# Patient Record
Sex: Male | Born: 1975 | Smoking: Current every day smoker
Health system: Southern US, Community
[De-identification: ages and names within clinical notes are randomized; demographics above are authoritative.]

## PROBLEM LIST (undated history)

## (undated) DIAGNOSIS — E785 Hyperlipidemia, unspecified: Secondary | ICD-10-CM

## (undated) DIAGNOSIS — E1169 Type 2 diabetes mellitus with other specified complication: Secondary | ICD-10-CM

## (undated) DIAGNOSIS — E119 Type 2 diabetes mellitus without complications: Principal | ICD-10-CM

## (undated) DIAGNOSIS — I10 Essential (primary) hypertension: Secondary | ICD-10-CM

## (undated) HISTORY — PX: KNEE SURGERY: SHX244

---

## 2012-12-03 MED ORDER — CIPROFLOXACIN HCL 250 MG PO TABS
250 MG | ORAL_TABLET | Freq: Two times a day (BID) | ORAL | Status: AC
Start: 2012-12-03 — End: 2012-12-13

## 2012-12-03 MED ORDER — BENZONATATE 200 MG PO CAPS
200 MG | ORAL_CAPSULE | Freq: Three times a day (TID) | ORAL | Status: DC | PRN
Start: 2012-12-03 — End: 2013-07-07

## 2012-12-03 MED ORDER — LISINOPRIL 20 MG PO TABS
20 MG | ORAL_TABLET | Freq: Every day | ORAL | Status: DC
Start: 2012-12-03 — End: 2012-12-23

## 2012-12-03 NOTE — Progress Notes (Signed)
Subjective:      Christopher [name] is a 37 y.o. male who presents for evaluation of hypertension. He indicates that he is feeling well and denies any symptoms referable to his elevated blood pressure. Specifically denies chest pain, palpitations, dyspnea, orthopnea, PND or peripheral edema. Current medication regimen is as listed below. Patient denies any side effects of medication.  Has been out of medication for 3 years.  Has been taking sudafed.    URI sx for 8 days.  Cough, congestion, LGF and chills.  Denies CP/SOB.  + sinus congestion.      ROS: No TIA's or unusual headaches, no dysphagia.  No prolonged cough. No dyspnea or chest pain on exertion.  No abdominal pain, change in bowel habits, black or bloody stools.  No urinary tract or BPH symptoms.  No new or unusual musculoskeletal symptoms.    PMH, PSH, FH, SH reviewed and updated as needed.    No current outpatient prescriptions on file.     No current facility-administered medications for this visit.       No Known Allergies    History   Substance Use Topics   ??? Smoking status: Former Smoker   ??? Smokeless tobacco: Never Used      Comment: quti 2011, but smoked since age 12,  2.5 ppd   ??? Alcohol Use: Yes          Objective:      BP 150/102   Pulse 103   Temp(Src) 96.2 ??F (35.7 ??C) (Temporal)   Ht 5' 9.5" (1.765 m)   Wt 233 lb 12.8 oz (106.051 kg)   BMI 34.04 kg/m2   SpO2 98%     No acute distress.  Alert and Oriented x 3  HEENT: Atraumatic. Normocephalic. PERRLA, EOMI, Conjunctiva clear  Oropharynx clear, mucosa moist.  Nasal mucosa boggy  NECK: without thyromegaly, lymphadenopathy, JVD  LUNGS:Clear to ascultation bilaterally.  Breathing comfortably  CARDIOVASCULAR:  Regular rate and rhythm, no murmurs, rubs, or gallops  ABDOMEN: Soft, nontender, nondistended.  No hepatosplenomegaly.  EXTREMITY: Full range of motion. No clubbing/cyanosis/edema  NEURO: Cranial nerves II-XII grossly intact.  Strength 5/5, DTR 2/4.  SKIN: Warm, Dry, No rash.  PSYCH: Mood and  Affect normal.    Assessment:      Hypertension - poorly controlled  URI     Plan:     No sudafed  1)  Per Orders  2)  Regular aerobic exercise  3)  Sodium Restriction in diet

## 2012-12-23 MED ORDER — LISINOPRIL 20 MG PO TABS
20 MG | ORAL_TABLET | Freq: Every day | ORAL | Status: DC
Start: 2012-12-23 — End: 2013-01-04

## 2012-12-23 NOTE — Progress Notes (Signed)
Subjective:      Christopher [name] is a 37 y.o. male who presents for evaluation of hypertension. He indicates that he is feeling well and denies any symptoms referable to his elevated blood pressure. Specifically denies chest pain, palpitations, dyspnea, orthopnea, PND or peripheral edema. Current medication regimen is as listed below. Patient denies any side effects of medication.    Current Outpatient Prescriptions   Medication Sig Dispense Refill   ??? lisinopril (PRINIVIL;ZESTRIL) 20 MG tablet Take 1 tablet by mouth daily.  30 tablet  0   ??? benzonatate (TESSALON) 200 MG capsule Take 1 capsule by mouth 3 times daily as needed for Cough.  30 capsule  0     No current facility-administered medications for this visit.       No Known Allergies    History   Substance Use Topics   ??? Smoking status: Former Smoker   ??? Smokeless tobacco: Never Used      Comment: quti 2011, but smoked since age 33,  2.5 ppd   ??? Alcohol Use: Yes          Objective:      BP 138/86   Pulse 52   Temp(Src) 95.2 ??F (35.1 ??C) (Temporal)   Wt 237 lb 9.6 oz (107.775 kg)   BMI 34.6 kg/m2   SpO2 97%  General: Appears well, in no apparent distress  S1 and S2 normal, no murmurs, clicks, gallops or rubs. Regular rate and rhythm. Chest is clear; no wheezes or rales. No edema or JVD.       Assessment:      Hypertension - stable and well controlled       Plan:       1)  Per Orders  2)  Regular aerobic exercise  3)  Sodium Restriction in diet     Christopher [name] received counseling on the following healthy behaviors: nutrition, exercise and medication adherence    Patient given educational materials on Hypertension    I have instructed Christopher [name] to complete a self tracking handout on Blood Pressures  and instructed them to bring it with them to his next appointment.     Discussed use, benefit, and side effects of prescribed medications.  Barriers to medication compliance addressed.  All patient questions answered.  Pt voiced understanding.

## 2012-12-24 LAB — COMPREHENSIVE METABOLIC PANEL
ALT: 52 U/L (ref 0–60)
AST: 31 U/L (ref 0–46)
Albumin/Globulin Ratio: 2.2 (CALC) (ref 0.8–2.6)
Albumin: 5 GM/DL (ref 3.5–5.2)
Alkaline Phosphatase: 84 U/L (ref 23–144)
BUN/Creatinine Ratio: 13 (CALC) (ref 7.0–25.0)
BUN: 13 MG/DL (ref 3–29)
CO2: 26 MEQ/L (ref 19–32)
Calcium: 9.8 MG/DL (ref 8.5–10.5)
Chloride: 100 MEQ/L (ref 96–110)
Creatinine: 1 MG/DL (ref 0.5–1.4)
Est, Glom Filt Rate: 96 mL/min/{1.73_m2}
Globulin: 2.3 GM/DL (CALC) (ref 1.9–3.6)
Glucose: 132 MG/DL — ABNORMAL HIGH
Potassium: 4.7 MEQ/L (ref 3.4–5.3)
Sodium: 140 MEQ/L (ref 135–148)
Total Bilirubin: 0.5 MG/DL (ref 0.0–1.2)
Total Protein: 7.3 GM/DL (ref 6.0–8.3)

## 2012-12-24 LAB — LIPID PANEL
Cholesterol, Total: 212 MG/DL — ABNORMAL HIGH
HDL: 28 MG/DL — ABNORMAL LOW
LDL Cholesterol: 135 MG/DL (CALC) — ABNORMAL HIGH
Triglycerides: 244 MG/DL — ABNORMAL HIGH
VLDL: 49 MG/DL (CALC) — ABNORMAL HIGH (ref 4–30)

## 2013-01-04 ENCOUNTER — Encounter

## 2013-01-04 MED ORDER — LISINOPRIL 20 MG PO TABS
20 MG | ORAL_TABLET | Freq: Every day | ORAL | Status: DC
Start: 2013-01-04 — End: 2013-09-12

## 2013-07-07 MED ORDER — GENTAMICIN SULFATE 0.3 % OP SOLN
0.3 % | Freq: Four times a day (QID) | OPHTHALMIC | Status: DC
Start: 2013-07-07 — End: 2013-07-07

## 2013-07-07 MED ORDER — GENTAMICIN SULFATE 0.3 % OP SOLN
0.3 % | Freq: Four times a day (QID) | OPHTHALMIC | Status: AC
Start: 2013-07-07 — End: 2013-07-17

## 2013-07-07 NOTE — Progress Notes (Signed)
SUBJECTIVE:   37 y.o. male with burning, redness, discharge and mattering in left eye for 2 days.  No other symptoms.  No significant prior ophthalmological history. No change in visual acuity, no photophobia, no severe eye pain.    OBJECTIVE:   Patient appears well, vitals signs are normal. Eyes: left eye with findings of typical conjunctivitis noted; erythema and discharge. PERRLA, no foreign body noted. No periorbital cellulitis. The corneas are clear and fundi normal. Visual acuity normal.     ASSESSMENT:   Conjunctivitis - probably bacterial    PLAN:   Antibiotic drops per order. Hygiene discussed. If other family members develop same condition, may use same medication for them if they are not known to be allergic to it. Call prn.

## 2013-08-16 MED ORDER — METHOCARBAMOL 750 MG PO TABS
750 MG | ORAL_TABLET | Freq: Three times a day (TID) | ORAL | Status: DC
Start: 2013-08-16 — End: 2014-05-29

## 2013-08-16 MED ORDER — NAPROXEN 500 MG PO TABS
500 MG | ORAL_TABLET | Freq: Two times a day (BID) | ORAL | Status: DC
Start: 2013-08-16 — End: 2014-05-29

## 2013-08-16 MED ORDER — NAPROXEN 500 MG PO TABS
500 MG | ORAL_TABLET | Freq: Two times a day (BID) | ORAL | Status: DC
Start: 2013-08-16 — End: 2013-08-16

## 2013-08-16 NOTE — Progress Notes (Signed)
SUBJECTIVE:  Christopher [name] is a 37 y.o. male who sustained a left shoulder injury 3 week(s) ago. Mechanism of injury: fell. Immediate symptoms: immediate pain, was able to use arm directly after injury, no deformity was noted by the patient. Symptoms have been constant since that time. Prior history of related problems: no prior problems with this area in the past.    OBJECTIVE:  Vital signs as noted above.  Appearance: alert, well appearing, and in no distress, oriented to person, place, and time and normal appearing weight.  Shoulder exam: soft tissue tenderness and swelling at the anterior shoulder, reduced active range of motion of left shoulder ( abduction and forward flexion.  Full passive ROM.    X-ray: ordered, but results not yet available.    ASSESSMENT:  Shoulder strain    PLAN:  rest the injured area as much as practical, X-Ray ordered, prescription for muscle relaxant, prescription for NSAID given  See orders for this visit as documented in the electronic medical record.

## 2013-08-17 NOTE — Progress Notes (Signed)
LM to call office

## 2013-08-17 NOTE — Progress Notes (Signed)
Patient notified

## 2013-09-12 ENCOUNTER — Telehealth

## 2013-09-12 MED ORDER — LISINOPRIL 20 MG PO TABS
20 MG | ORAL_TABLET | Freq: Every day | ORAL | Status: DC
Start: 2013-09-12 — End: 2014-05-29

## 2013-09-12 NOTE — Telephone Encounter (Signed)
Pt left a msg needing a refill on his Lisinopril.

## 2013-09-12 NOTE — Telephone Encounter (Signed)
Prescription sent

## 2014-05-29 LAB — COMPREHENSIVE METABOLIC PANEL
ALT: 19 U/L (ref 10–40)
AST: 14 IU/L — ABNORMAL LOW (ref 15–37)
Albumin: 4.9 GM/DL (ref 3.4–5.0)
Alkaline Phosphatase: 69 IU/L (ref 40–128)
Anion Gap: 10 (ref 4–16)
BUN: 13 MG/DL (ref 6–23)
CO2: 27 MMOL/L (ref 21–32)
Calcium: 9.5 MG/DL (ref 8.3–10.6)
Chloride: 100 mMol/L (ref 99–110)
Creatinine: 0.7 MG/DL — ABNORMAL LOW (ref 0.9–1.3)
GFR African American: >60 mL/min/{1.73_m2}
GFR Non-African American: >60 mL/min/{1.73_m2}
Glucose, Fasting: 217 MG/DL — ABNORMAL HIGH (ref 70–99)
Potassium: 4.4 MMOL/L (ref 3.5–5.1)
Sodium: 137 MMOL/L (ref 136–145)
Total Bilirubin: 0.8 MG/DL (ref 0.0–1.0)
Total Protein: 7 GM/DL (ref 6.4–8.2)

## 2014-05-29 LAB — LIPID PANEL
Cholesterol: 170 MG/DL (ref <200)
HDL: 35 MG/DL — ABNORMAL LOW (ref >40)
LDL Direct: 116 MG/DL — ABNORMAL HIGH (ref <100)
Triglycerides: 152 MG/DL — ABNORMAL HIGH (ref <150)

## 2014-05-29 MED ORDER — LISINOPRIL 20 MG PO TABS
20 MG | ORAL_TABLET | Freq: Every day | ORAL | Status: DC
Start: 2014-05-29 — End: 2014-09-21

## 2014-05-29 NOTE — Progress Notes (Signed)
Living Arrangements  Current living arrangements:  with family:  spouse    Health Literacy  Do you understand how to properly take your prescribed medications? (For example: how often and how many times a day you should take your medications)  Yes    Do you understand the reason(s) you were prescribed medication(s) by your provider?  Yes

## 2014-05-29 NOTE — Progress Notes (Signed)
Subjective:      Christopher [name] is a 38 y.o. male who presents for evaluation of hypertension. He indicates that he is feeling well and denies any symptoms referable to his elevated blood pressure. Specifically denies chest pain, palpitations, dyspnea, orthopnea, PND or peripheral edema. Current medication regimen is as listed below. Patient denies any side effects of medication.    Had elevated blood sugar on last fasting labs. FH of DM.     Current Outpatient Prescriptions   Medication Sig Dispense Refill   ??? lisinopril (PRINIVIL;ZESTRIL) 20 MG tablet Take 1 tablet by mouth daily.  90 tablet  1     No current facility-administered medications for this visit.       No Known Allergies    History   Substance Use Topics   ??? Smoking status: Former Smoker   ??? Smokeless tobacco: Never Used      Comment: quti 2011, but smoked since age 38,  2.5 ppd   ??? Alcohol Use: Yes          Objective:      BP 134/72    Pulse 60    Temp(Src) 97.1 ??F (36.2 ??C) (Temporal)    Wt 210 lb 9.6 oz (95.528 kg)    SpO2 97%   General: Appears well, in no apparent distress  S1 and S2 normal, no murmurs, clicks, gallops or rubs. Regular rate and rhythm. Chest is clear; no wheezes or rales. No edema or JVD.       Assessment:      Hypertension - stable and well controlled  hyperglycemia     Plan:       1)  Per Orders  2)  Regular aerobic exercise  3)  Sodium Restriction in diet     Christopher [name] received counseling on the following healthy behaviors: nutrition, exercise and medication adherence    Patient given educational materials on Hypertension    I have instructed Christopher [name] to complete a self tracking handout on Blood Pressures  and instructed them to bring it with them to his next appointment.     Discussed use, benefit, and side effects of prescribed medications.  Barriers to medication compliance addressed.  All patient questions answered.  Pt voiced understanding.

## 2014-06-14 MED ORDER — ATORVASTATIN CALCIUM 20 MG PO TABS
20 MG | ORAL_TABLET | Freq: Every day | ORAL | Status: DC
Start: 2014-06-14 — End: 2014-09-21

## 2014-06-14 MED ORDER — METFORMIN HCL 1000 MG PO TABS
1000 MG | ORAL_TABLET | Freq: Two times a day (BID) | ORAL | Status: DC
Start: 2014-06-14 — End: 2014-09-21

## 2014-06-14 NOTE — Progress Notes (Signed)
Christopher KaufmannBryan D Channell is a 38 y.o. male who presents for evaluation of hypertension, hyperlipidemia, and newly diagnosed diabetes.Marland Kitchen. He indicates that he is feeling well and denies any symptoms referable to his elevated blood pressure.   Specifically denies chest pain, palpitations, dyspnea, orthopnea, PND or peripheral edema.  No anorexia, arthralgia, or leg cramps noted. Current medication regimen is as listed below. He denies any side effects of medication, and has been taking it regularly. diabetic diet compliance:  compliant most of the time, home glucose monitoring: are not performed, further diabetic ROS: no polyuria or polydipsia, no chest pain, dyspnea or TIAs, no numbness, tingling or pain in extremities, last eye exam approximately over one year ago.    Current Outpatient Prescriptions   Medication Sig Dispense Refill   ??? lisinopril (PRINIVIL;ZESTRIL) 20 MG tablet Take 1 tablet by mouth daily  90 tablet  1     No current facility-administered medications for this visit.       No Known Allergies    History   Substance Use Topics   ??? Smoking status: Former Smoker   ??? Smokeless tobacco: Never Used      Comment: quti 2011, but smoked since age 38,  2.5 ppd   ??? Alcohol Use: Yes          Objective:      BP 122/78    Pulse 83    Temp(Src) 97 ??F (36.1 ??C) (Temporal)    Wt 206 lb 3.2 oz (93.532 kg)    SpO2 96%   General: Alert and oriented, in no distress   S1 and S2 normal, no murmurs, clicks, gallops or rubs. Regular rate and rhythm. Chest is clear; no wheezes or rales. No edema or JVD.  heart sounds regular rate and rhythm, S1, S2 normal, no murmur, click, rub or gallop, chest clear, no hepatosplenomegaly, no carotid bruits, feet: normal DP and PT pulses, no trophic changes or ulcerative lesions and normal sensory exam     Assessment:      Essential hypertension - well controlled and stable  Hyperlipidemia - asymptomatic  Diabetes--asymptomatic     Plan:     will start metformin and Lipitor     Recheck in 3months,  sooner should new symptoms or problems arise.     [name] received counseling on the following healthy behaviors: nutrition, exercise and medication adherence    Patient given educational materials on Diabetes    I have instructed [name] to complete a self tracking handout on Blood Sugars  and instructed them to bring it with them to his next appointment.     Discussed use, benefit, and side effects of prescribed medications.  Barriers to medication compliance addressed.  All patient questions answered.  Pt voiced understanding.

## 2014-06-14 NOTE — Telephone Encounter (Signed)
Patient picked up Metformin and it says to talk to your doctor about the safe use of this medication with alcohol. Patient is wondering what that means. Patient states he likes to have a few beers every once in awhile. Patient states he's getting together with some buddies on Saturday and he might have quite a few but if he's not allowed then he wont. Please advise.

## 2014-06-15 NOTE — Telephone Encounter (Signed)
The metformin is safe with alcohol. It is only concerning with abuse of alcohol.

## 2014-06-15 NOTE — Telephone Encounter (Signed)
Patient notified.

## 2014-07-18 NOTE — Progress Notes (Signed)
Diabetes Self-Management Education Record    Participant Name: Christopher [name]  Referring Provider: Aleatha Borer, MD  Assessment/Evaluation Ratings:  1=Needs Instruction   4=Demonstrates Understanding/Competency  2=Needs Review   NC=Not Covered    3=Comprehends Key Points  N/A=Not Applicable  Topics/Learning Objectives Pre-session Assess Date:   Instr. Date Reinforce Date Post- session Eval Comments   Diabetes disease process & Treatment process: Define diabetes & pre-diabetes; Identify own type of diabetes; role of the pancreas; signs/symptoms; diagnostic criteria; prevention & treatment options; contributing factors. 1 07/18/14  3 Pt attended session alone. Newly diagnosed. Strong family hx. Attentive to instruction.   Incorporating nutritional management into lifestyle: Describe effect of type, amount & timing of food on blood glucose; Describe basic meal planning techniques & current nutrition guidelines;Correctly read food labels & demonstrate CHO counting & portion control with personalized meal plan. Identify dining out strategies, & dietary sick day guidelines. na    Pt will attend Nutrition session 07/19/14.   Incorporating physical activity into lifestyle:   Verbalize effect of exercise on blood glucose levels; benefits of regular exercise; safety considerations; contraindications; maintenance of activity. 1 07/18/14  3    Using medications safely:  Identify effects of diabetes medicines on blood glucose levels; List diabetes medication taken, action & side effects; appropriate injection sites; proper storage; supplies needed; proper technique; safe needle disposal guidelines. 1 07/18/14  3 Pt taking 1051m Metformin, bid.   Monitoring blood glucose, interpreting and using results:  Identify recommended & personal blood glucose targets; importance of testing; testing supplies; HgbA1C target levels; Factors affecting blood glucose; Importance of logging blood glucose levels for pattern recognition; ketone  testing; safe lancet disposal. 1 07/18/14  3 Pt is monitoring bs daily.   Prevention, detection & treatment of acute complications:  Identify symptoms of hyper & hypoglycemia, and prevention & treatment strategies. Describe sick day guidelines & indications for ketone testing & physician notification. Identify short term consequences of poor control. 1 07/18/14  3    Prevention, detection & treatment of chronic complications:  Define the natural course of diabetes & describe the relationship of blood glucose levels to long term complications of diabetes. Identify preventative measures & standards of care. 1 07/18/14  3    Developing strategies to address psychosocial issues:  Describe feelings about living with diabetes; Describe how stress, depression & anxiety affect blood glucose; Identify coping strategies; Identify support needed & support network available. 1 07/18/14  3 Pt states had been in denial but is accepting dx.   Developing strategies to promote health/change behavior: Identify 7 self-care behaviors; Personal health risk factors; Benefits, challenges & strategies for behavioral change; Individualized goal selection. 1 07/18/14  3      Identified Barriers to learning/adherence to self management plan:     '[x]' None  '[]' Physical  '[]' Visual  '[]' Hearing  '[]' Speech  '[]' Emotional  '[]' Cognitive    '[]' Reading  '[]' Language  '[]' Accessibility  '[]' Financial    Instruction Method: '[x]' Lecture/Discussion  '[]' PowerPoint Presentation  '[x]' Handouts   '[]' Return Demonstration  Education Materials/Equipment Provided:  '[x]' Self-Management Manual  '[]' Meal Plan '[]' Glucose Meter  '[]' Insulin Kit  '[]' Nutritional Packet  '[]' Survival Packet   '[]' Other  '[]'  Gestational Pathway Initiated & Signed    Encounter Type Date Start Time End Time Comments No Show Dates   Assessment & class 1 07/18/14 1730 2015   '[x]' In Person  '[]' Telephone    Session 1         Session 2  Session 3         Individual MNT         Gestational MNT         Shared Med Appt          Yearly Follow-up        Meter Instrx 07/18/14       Insulin Instrx      '[]' Pen  '[]' Vial & Syringe      Additional Comments: Pt states will access a Diabetic website to continue to learn about Diabetic self mngt skills.    DSMS Support Plan:  Follow-up plan/Date: Will f/u in 4-12 mos. By phone.  Contact Post Class Regarding:   Fasting Blood Sugar   HgbA1C   Weight   Self-Foot Exam Frequency   Monitoring Frequency   Exercise Routine   Goal Attainment  Post Education Referrals:       '[]' WIC   '[]' PAP   '[]' Wound Care   '[]' Social Service   '[]' Home Health  '[x]' Support Group   '[]' Physician Specialist   '[]' Rehabilitation   '[]' Other    Dedra Matsuo M. Lynelle Smoke, RN CRRN

## 2014-07-21 NOTE — Progress Notes (Signed)
Diabetes Self-Management Education Record    Participant Name: Christopher [name]  Referring Provider: Aleatha Borer, MD  Assessment/Evaluation Ratings:  1=Needs Instruction   4=Demonstrates Understanding/Competency  2=Needs Review   NC=Not Covered    3=Comprehends Key Points  N/A=Not Applicable  Topics/Learning Objectives Pre-session Assess Date:   Instr. Date Reinforce Date Post- session Eval Comments   Diabetes disease process & Treatment process: Define diabetes & pre-diabetes; Identify own type of diabetes; role of the pancreas; signs/symptoms; diagnostic criteria; prevention & treatment options; contributing factors.     NC, information presented in first day of class 07/18/2014   Incorporating nutritional management into lifestyle: Describe effect of type, amount & timing of food on blood glucose; Describe basic meal planning techniques & current nutrition guidelines;Correctly read food labels & demonstrate CHO counting & portion control with personalized meal plan. Identify dining out strategies, & dietary sick day guidelines. 07/18/2014 07/19/2014  3 Receptive to information.   Incorporating physical activity into lifestyle:   Verbalize effect of exercise on blood glucose levels; benefits of regular exercise; safety considerations; contraindications; maintenance of activity.     NC, information presented in first day of class 07/18/2014   Using medications safely:  Identify effects of diabetes medicines on blood glucose levels; List diabetes medication taken, action & side effects; appropriate injection sites; proper storage; supplies needed; proper technique; safe needle disposal guidelines.     NC, information presented in first day of class 07/18/2014   Monitoring blood glucose, interpreting and using results:  Identify recommended & personal blood glucose targets; importance of testing; testing supplies; HgbA1C target levels; Factors affecting blood glucose; Importance of logging blood glucose levels for pattern  recognition; ketone testing; safe lancet disposal.     NC, information presented in first day of class 07/18/2014   Prevention, detection & treatment of acute complications:  Identify symptoms of hyper & hypoglycemia, and prevention & treatment strategies. Describe sick day guidelines & indications for ketone testing & physician notification. Identify short term consequences of poor control.     NC, information presented in first day of class 07/18/2014   Prevention, detection & treatment of chronic complications:  Define the natural course of diabetes & describe the relationship of blood glucose levels to long term complications of diabetes. Identify preventative measures & standards of care.     NC, information presented in first day of class 07/18/2014   Developing strategies to address psychosocial issues:  Describe feelings about living with diabetes; Describe how stress, depression & anxiety affect blood glucose; Identify coping strategies; Identify support needed & support network available.     Hands Depression Tool Score=5  _0 Physician notified of suicidal ideations   Developing strategies to promote health/change behavior: Identify 7 self-care behaviors; Personal health risk factors; Benefits, challenges & strategies for behavioral change; Individualized goal selection.     Goal: eat 3 meals each day, read food labels, record glucose results in logbook, get dental exam every 6 months     Identified Barriers to learning/adherence to self management plan:     _1 None  _2 Physical  _3 Visual  _4 Hearing  _5 Speech  _6 Emotional  _7 Cognitive    _8 Reading  _9 Language  _10 Accessibility  _11 Financial    Instruction Method: _12 Lecture/Discussion  _13 PowerPoint Presentation  _14 Handouts   _15 Return Demonstration  Education Materials/Equipment Provided:  _16 Self-Management Manual  _17 Meal Plan _18 Glucose Meter  _19 Insulin Kit  _20 Nutritional Packet  _21 Survival Packet   _22 Other  _23  Gestational Pathway Initiated & Signed    Encounter  Type  Date Start Time End Time Comments No Show Dates   Assessment      _0 In Person  _1 Telephone    Session 1         Session 2 07/19/2014 17:30 20:35     Session 3         Individual MNT         Gestational MNT         Shared Med Appt         Yearly Follow-up        Meter Instrx        Insulin Instrx      _2 Pen  _3 Vial & Syringe      Additional Comments: Attended class alone.     DSMS Support Plan:  Follow-up plan/Date: F/u within 4-12 months  Contact Post Class Regarding:   Fasting Blood Sugar   HgbA1C   Weight   Hypertension/Follow-up with Physician   Self-Foot Exam Frequency   Monitoring Frequency   Exercise Routine   Goal Attainment  Post Education Referrals:       _4 WIC   _5 PAP   _6 Wound Care   _7 Social Service   _8 Home Health  _9 Support Group   _10 Physician Specialist   _11 Rehabilitation   _12 Other

## 2014-09-21 ENCOUNTER — Ambulatory Visit
Admit: 2014-09-21 | Discharge: 2014-09-21 | Payer: BLUE CROSS/BLUE SHIELD | Attending: Family Medicine | Primary: Family Medicine

## 2014-09-21 DIAGNOSIS — IMO0002 Reserved for concepts with insufficient information to code with codable children: Secondary | ICD-10-CM

## 2014-09-21 LAB — POCT GLYCOSYLATED HEMOGLOBIN (HGB A1C): Hemoglobin A1C: 6.2 %

## 2014-09-21 MED ORDER — METFORMIN HCL 1000 MG PO TABS
1000 MG | ORAL_TABLET | Freq: Two times a day (BID) | ORAL | Status: DC
Start: 2014-09-21 — End: 2015-04-20

## 2014-09-21 MED ORDER — LISINOPRIL 20 MG PO TABS
20 MG | ORAL_TABLET | Freq: Every day | ORAL | Status: DC
Start: 2014-09-21 — End: 2015-04-20

## 2014-09-21 MED ORDER — ATORVASTATIN CALCIUM 20 MG PO TABS
20 MG | ORAL_TABLET | Freq: Every day | ORAL | Status: DC
Start: 2014-09-21 — End: 2015-04-20

## 2014-09-21 NOTE — Progress Notes (Signed)
Christopher [name] is a 38 y.o. male who presents for evaluation of hypertension, hyperlipidemia, and diabetes.Marland Kitchen He indicates that he is feeling well and denies any symptoms referable to his elevated blood pressure.   Specifically denies chest pain, palpitations, dyspnea, orthopnea, PND or peripheral edema.  No anorexia, arthralgia, or leg cramps noted. Current medication regimen is as listed below. He denies any side effects of medication, and has been taking it regularly. medication compliance:  compliant all of the time, diabetic diet compliance:  compliant most of the time, home glucose monitoring: are performed regularly, values range 101-125, further diabetic ROS: no polyuria or polydipsia, no chest pain, dyspnea or TIAs, no numbness, tingling or pain in extremities, last eye exam approximately 11 months ago.  Having loose stool since starting metformin, but is improving.    Current Outpatient Prescriptions   Medication Sig Dispense Refill   ??? Blood Glucose Monitoring Suppl (ACCU-CHEK AVIVA PLUS) W/DEVICE KIT   0   ??? ACCU-CHEK AVIVA PLUS strip   5   ??? Accu-Chek Softclix Lancets MISC   5   ??? metFORMIN (GLUCOPHAGE) 1000 MG tablet Take 1 tablet by mouth 2 times daily (with meals) 60 tablet 5   ??? atorvastatin (LIPITOR) 20 MG tablet Take 1 tablet by mouth daily 30 tablet 5   ??? lisinopril (PRINIVIL;ZESTRIL) 20 MG tablet Take 1 tablet by mouth daily 90 tablet 1     No current facility-administered medications for this visit.       No Known Allergies    History   Substance Use Topics   ??? Smoking status: Former Smoker   ??? Smokeless tobacco: Never Used      Comment: [place] 2011, but smoked since age 65,  2.5 ppd   ??? Alcohol Use: Yes          Objective:      BP 108/76 mmHg   Pulse 69   Temp(Src) 96.7 ??F (35.9 ??C) (Temporal)   Wt 204 lb (92.534 kg)  General: Alert and oriented, in no distress   S1 and S2 normal, no murmurs, clicks, gallops or rubs. Regular rate and rhythm. Chest is clear; no wheezes or rales. No edema or  JVD.  heart sounds regular rate and rhythm, S1, S2 normal, no murmur, click, rub or gallop, chest clear, no hepatosplenomegaly, no carotid bruits, feet: normal DP and PT pulses, no trophic changes or ulcerative lesions and normal sensory exam  A1c 6.2     Assessment:      Essential hypertension - well controlled and stable  Hyperlipidemia - asymptomatic  Diabetes--well controlled and stable     Plan:       1)  Medication: continue current medication regimen unchanged  2)  Recheck in 3 months, sooner should new symptoms or problems arise.       Christopher [name] received counseling on the following healthy behaviors: nutrition, exercise and medication adherence    Patient given educational materials on Diabetes    I have instructed Christopher [name] to complete a self tracking handout on Blood Sugars  and instructed them to bring it with them to his next appointment.     Discussed use, benefit, and side effects of prescribed medications.  Barriers to medication compliance addressed.  All patient questions answered.  Pt voiced understanding.

## 2014-09-22 LAB — LIPID PANEL
Cholesterol, Total: 102 MG/DL
HDL: 36 MG/DL — ABNORMAL LOW
LDL Cholesterol: 44 MG/DL (CALC)
Triglycerides: 112 MG/DL
VLDL: 22 MG/DL (CALC) (ref 4–30)

## 2014-09-22 LAB — HEPATIC FUNCTION PANEL
ALT: 35 U/L (ref 0–60)
AST: 23 U/L (ref 0–46)
Albumin/Globulin Ratio: 2.2 (CALC) (ref 0.8–2.6)
Albumin: 5 GM/DL (ref 3.5–5.2)
Alkaline Phosphatase: 60 U/L (ref 23–144)
Bilirubin, Direct: 0.2 MG/DL (ref 0.0–0.4)
Bilirubin, Indirect: 0.6 MG/DL (CALC) (ref 0.0–1.2)
Globulin: 2.3 GM/DL (CALC) (ref 1.9–3.6)
Total Bilirubin: 0.8 MG/DL (ref 0.0–1.2)
Total Protein: 7.3 GM/DL (ref 6.0–8.3)

## 2014-11-27 ENCOUNTER — Encounter: Attending: Family Medicine | Primary: Family Medicine

## 2015-01-16 NOTE — Progress Notes (Signed)
Diabetes Self-Management Program Follow-Up    Name:  Christopher [name]   DOB:  [birth year]     Follow-Up Date:  01/16/2015     Class Dates:  07-18-2014    Goal:  Take DM Meds on time  How often did you meet your goal?     '[x]' All the time (5)   '[]' Most of the time (4)   '[]' Half the time (3)   '[]' Occasionally (2)    '[]' Never (1)  Modify goal: Cont    Goal: Take BG as instructed and record in logbook  How often did you meet your goal?     '[x]' All the time (5)   '[]' Most of the time (4)   '[]' Half the time (3)   '[]' Occasionally (2)    '[]' Never (1)  Modify goal: Cont    Goal:  Nutrition 3 meal per day/Enlist family support Read labels  How often did you meet your goal?     '[]' All the time (5)   '[x]' Most of the time (4)   '[]' Half the time (3)   '[]' Occasionally (2)    '[]' Never (1)  Modify goal: Cont    Goal:  Reduce risks Dental exam every 6 month  How often did you meet your goal?     '[]' All the time (5)   '[]' Most of the time (4)   '[]' Half the time (3)   '[]' Occasionally (2)    '[x]' Never (1)  Modify goal: Not met  "No teeth"      Wt:   203 Date: 01-15-2014 self reported  A1C: 6.2 Date: 09-21-14    Physician Appointment (date of most recent or next scheduled): "Every 3 month"  Recent health problems or change in diabetes medications: "No"    Do you know the amount of carbohydrates you eat per meal?   '[x]' Yes   '[]' No   Frequency of self-foot exam:   '[x]' Daily  '[]' Other   Eye exam scheduled?   '[x]' Yes   '[]' No Last year not due yet  How many times a day do you check your blood sugar?    '[x]' 1   '[]' 2   '[]' 3   '[]' 4   '[]' more  Have you been exercising since class?    '[x]' Yes    '[]' No   If yes, what kind? Walk at work   If yes, how many days/week?   '[]' 1   '[]' 2   '[]' 3   '[]' 4   '[x]' more  Feelings/Attitudes/Other questions: Declines FU session "Doing well"  States Dr says he is right where he needs to be   [name] A. Phineas Douglas RN, BSN, CDE

## 2015-01-16 NOTE — Other (Signed)
01/16/15 1705   General Information   Contact made? Y   Which attempt is this? 1   Reason patient was called? Other  (Fu Diabetes Class)

## 2015-04-20 ENCOUNTER — Ambulatory Visit
Admit: 2015-04-20 | Discharge: 2015-04-20 | Payer: BLUE CROSS/BLUE SHIELD | Attending: Family Medicine | Primary: Family Medicine

## 2015-04-20 DIAGNOSIS — IMO0001 Reserved for inherently not codable concepts without codable children: Secondary | ICD-10-CM

## 2015-04-20 LAB — POCT GLYCOSYLATED HEMOGLOBIN (HGB A1C): Hemoglobin A1C: 6.5 %

## 2015-04-20 MED ORDER — METFORMIN HCL 1000 MG PO TABS
1000 MG | ORAL_TABLET | Freq: Two times a day (BID) | ORAL | Status: DC
Start: 2015-04-20 — End: 2015-10-26

## 2015-04-20 MED ORDER — ALPRAZOLAM 0.25 MG PO TABS
0.25 MG | ORAL_TABLET | ORAL | Status: DC
Start: 2015-04-20 — End: 2015-10-26

## 2015-04-20 MED ORDER — LISINOPRIL 20 MG PO TABS
20 MG | ORAL_TABLET | Freq: Every day | ORAL | Status: DC
Start: 2015-04-20 — End: 2015-10-26

## 2015-04-20 MED ORDER — ATORVASTATIN CALCIUM 20 MG PO TABS
20 MG | ORAL_TABLET | Freq: Every day | ORAL | Status: DC
Start: 2015-04-20 — End: 2015-10-26

## 2015-04-20 NOTE — Progress Notes (Signed)
Christopher [name] is a 39 y.o. male who presents for evaluation of hypertension, hyperlipidemia, and diabetes.Marland Kitchen He indicates that he is feeling well and denies any symptoms referable to his elevated blood pressure.   Specifically denies chest pain, palpitations, dyspnea, orthopnea, PND or peripheral edema.  No anorexia, arthralgia, or leg cramps noted. Current medication regimen is as listed below. He denies any side effects of medication, and has been taking it regularly. medication compliance:  compliant all of the time, diabetic diet compliance:  compliant most of the time, home glucose monitoring: are performed regularly, values range 135-140, further diabetic ROS: no polyuria or polydipsia, no chest pain, dyspnea or TIAs, no numbness, tingling or pain in extremities, last eye exam approximately over 1 year ago.    Patient wants to get a tattoo and the tattoo shop requires a letter stating that it is safe.    Patient is flying soon on business, and he has never flown in an airplane before and has anxiety.  He wants something to help.    ROS: No TIA's or unusual headaches, no dysphagia.  No prolonged cough. No dyspnea or chest pain on exertion.  No abdominal pain, change in bowel habits, black or bloody stools.  No urinary tract or BPH symptoms.  No new or unusual musculoskeletal symptoms.    No past medical history on file.  No past surgical history on file.      Current Outpatient Prescriptions   Medication Sig Dispense Refill   ??? Blood Glucose Monitoring Suppl (ACCU-CHEK AVIVA PLUS) W/DEVICE KIT   0   ??? ACCU-CHEK AVIVA PLUS strip   5   ??? Accu-Chek Softclix Lancets MISC   5   ??? atorvastatin (LIPITOR) 20 MG tablet Take 1 tablet by mouth daily 90 tablet 1   ??? metFORMIN (GLUCOPHAGE) 1000 MG tablet Take 1 tablet by mouth 2 times daily (with meals) 180 tablet 1   ??? lisinopril (PRINIVIL;ZESTRIL) 20 MG tablet Take 1 tablet by mouth daily 90 tablet 1     No current facility-administered medications for this visit.        No Known Allergies    History   Substance Use Topics   ??? Smoking status: Former Smoker   ??? Smokeless tobacco: Never Used      Comment: [place] 2011, but smoked since age 29,  2.5 ppd   ??? Alcohol Use: Yes          Objective:      BP 110/62 mmHg   Pulse 62   Temp(Src) 96.7 ??F (35.9 ??C) (Temporal)   Wt 210 lb 3.2 oz (95.346 kg)   SpO2 98%  General: Alert and oriented, in no distress   S1 and S2 normal, no murmurs, clicks, gallops or rubs. Regular rate and rhythm. Chest is clear; no wheezes or rales. No edema or JVD.  heart sounds regular rate and rhythm, S1, S2 normal, no murmur, click, rub or gallop, chest clear, no hepatosplenomegaly, no carotid bruits, feet: normal DP and PT pulses, no trophic changes or ulcerative lesions and normal monofilament exam\\  A1c 6.5     Assessment:      Essential hypertension - well controlled and stable  Hyperlipidemia - asymptomatic  Diabetes--well controlled and stable   Flight anxiety       Plan:       1)  Medication: continue current medication regimen unchanged  2)  Recheck in 6 months, sooner should new symptoms or problems arise.  Christopher [name] received counseling on the following healthy behaviors: nutrition, exercise and medication adherence    Patient given educational materials on Diabetes    I have instructed Christopher [name] to complete a self tracking handout on Blood Sugars  and instructed them to bring it with them to his next appointment.     Discussed use, benefit, and side effects of prescribed medications.  Barriers to medication compliance addressed.  All patient questions answered.  Pt voiced understanding.

## 2015-04-20 NOTE — Progress Notes (Signed)
BP Readings from Last 2 Encounters:   04/20/15 110/62   09/21/14 108/76     HEMOGLOBIN A1C (%)   Date Value   09/21/2014 6.2              Tobacco use:  Patient  reports that he has quit smoking. He has never used smokeless tobacco.    If Smoker - Cessation materials given? NA    Is Daily aspirin on medication list?:  No    Diabetic retinal exam done? No   If yes, document in Health Maintenance.     Monofilament placed on counter? Yes    Shoes and socks removed? Yes    BP taken with correct size cuff? Yes   Repeated if > 140/90 NA     Is patient taking any medications for diabetes    Yes   If yes, see medication list    Is the patient reporting any side effects of diabetic medications?   No    Microalbumin performed if applicable?  NA      Is patient taking any over the counter medications    NO   If yes, see medication list

## 2015-04-20 NOTE — Patient Instructions (Signed)
Learning About Anxiety Disorders  What are anxiety disorders?  Anxiety disorders are a type of medical problem. They cause severe anxiety. When you feel anxious, you feel that something bad is about to happen. This feeling interferes with your life.  These disorders include:  ?? Generalized anxiety disorder. You feel worried and stressed about many everyday events and activities. This goes on for several months and disrupts your life on most days.  ?? Panic disorder. You have repeated panic attacks. A panic attack is a sudden, intense fear or anxiety. It may make you feel short of breath. Your heart may pound.  ?? Social anxiety disorder. You feel very anxious about what you will say or do in front of people. For example, you may be scared to talk or eat in public. This problem affects your daily life.  ?? Phobias. You are very scared of a specific object, situation, or activity. For example, you may fear spiders, high places, or small spaces.  What are the symptoms?  Generalized anxiety disorder  Symptoms may include:  ?? Feeling worried and stressed about many things almost every day.  ?? Feeling tired or irritable. You may have a hard time concentrating.  ?? Having headaches or muscle aches.  ?? Having a hard time swallowing.  ?? Feeling shaky, sweating, or having hot flashes.  Panic disorder  You may have repeated panic attacks when there is no reason for feeling afraid. You may change your daily activities because you worry that you will have another attack.  Symptoms may include:  ?? Intense fear, terror, or anxiety.  ?? Trouble breathing or very fast breathing.  ?? Chest pain or tightness.  ?? A heartbeat that races or is not regular.  Social anxiety disorder  Symptoms may include:  ?? Fear about a social situation, such as eating in front of others or speaking in public. You may worry a lot. Or you may be afraid that something bad will happen.  ?? Anxiety that can cause you to blush, sweat, and feel shaky.  ?? A heartbeat  that is faster than normal.  ?? A hard time focusing.  Phobias  Symptoms may include:  ?? More fear than most people of being around an object, being in a situation, or doing an activity. You might also be stressed about the chance of being around the thing you fear.  ?? Worry about losing control, panicking, fainting, or having physical symptoms like a faster heartbeat when you are around the situation or object.  How are these disorders treated?  Anxiety disorders can be treated with medicines or counseling. A combination of both may be used.  Medicines may include:  ?? Antidepressants. These may help your symptoms by keeping chemicals in your brain in balance.  ?? Benzodiazepines. These may give you short-term relief of your symptoms.  Some people use cognitive-behavioral therapy. A therapist helps you learn to change stressful or bad thoughts into helpful thoughts.  Lead a healthy lifestyle  A healthy lifestyle may help you feel better.  ?? Get at least 30 minutes of exercise on most days of the week. Walking is a good choice.  ?? Eat a healthy diet. Include fruits, vegetables, lean proteins, and whole grains in your diet each day.  ?? Try to go to bed at the same time every night. Try for 8 hours of sleep a night.  ?? Find ways to manage stress. Try relaxation exercises.  ?? Avoid alcohol and illegal drugs.  Follow-up   care is a key part of your treatment and safety. Be sure to make and go to all appointments, and call your doctor if you are having problems. It's also a good idea to know your test results and keep a list of the medicines you take.   Where can you learn more?   Go to [email or web address] and sign in to your MyChart account. Enter K667 in the Search Health Information box to learn more about ???Learning About Anxiety Disorders.???    If you do not have an account, please click on the ???Sign Up Now??? link.     ?? 2006-2015 [employer]. Care instructions adapted under license by [facility]. This care instruction is for use with your licensed healthcare professional. If you have questions about a medical condition or this instruction, always ask your healthcare professional. Health[employer], Inc[employer] disclaims any warranty or liability for your use of this information.  Content Version: 10.6.465758; Current as of: September 23, 2013

## 2015-04-21 LAB — COMPREHENSIVE METABOLIC PANEL
ALT: 36 U/L (ref 0–60)
AST: 28 U/L (ref 0–46)
Albumin/Globulin Ratio: 2.5 (CALC) (ref 0.8–2.6)
Albumin: 5.3 GM/DL — ABNORMAL HIGH (ref 3.5–5.2)
Alkaline Phosphatase: 67 U/L (ref 23–144)
BUN/Creatinine Ratio: 16 (CALC) (ref 7.0–25.0)
BUN: 11 MG/DL (ref 3–29)
CO2: 25 MEQ/L (ref 19–32)
Calcium: 9.7 MG/DL (ref 8.5–10.5)
Chloride: 101 MEQ/L (ref 96–110)
Creatinine: 0.7 MG/DL (ref 0.5–1.4)
Est, Glom Filt Rate: 119 mL/min/{1.73_m2}
Globulin: 2.1 GM/DL (CALC) (ref 1.9–3.6)
Glucose: 114 MG/DL — ABNORMAL HIGH
Potassium: 4.2 MEQ/L (ref 3.4–5.3)
Sodium: 141 MEQ/L (ref 135–148)
Total Bilirubin: 1.4 MG/DL — ABNORMAL HIGH (ref 0.0–1.2)
Total Protein: 7.4 GM/DL (ref 6.0–8.3)

## 2015-04-21 LAB — LIPID PANEL
Cholesterol, Total: 116 MG/DL
HDL: 31 MG/DL — ABNORMAL LOW
LDL Cholesterol: 56 MG/DL (CALC)
Triglycerides: 144 MG/DL
VLDL: 29 MG/DL (CALC) (ref 4–30)

## 2015-04-21 LAB — MICROALBUMIN / CREATININE URINE RATIO
Creatinine: 170.9 MG/DL (ref >20)
Microalb, Ur: 640 ug/dL
Microalbumin Creatinine Ratio: 4 MCG/MG CREAT (ref 0–30)

## 2015-10-26 ENCOUNTER — Ambulatory Visit
Admit: 2015-10-26 | Discharge: 2015-10-26 | Payer: BLUE CROSS/BLUE SHIELD | Attending: Family Medicine | Primary: Family Medicine

## 2015-10-26 DIAGNOSIS — E119 Type 2 diabetes mellitus without complications: Secondary | ICD-10-CM

## 2015-10-26 LAB — POCT GLYCOSYLATED HEMOGLOBIN (HGB A1C): Hemoglobin A1C: 7.1 %

## 2015-10-26 MED ORDER — ATORVASTATIN CALCIUM 20 MG PO TABS
20 MG | ORAL_TABLET | Freq: Every day | ORAL | 1 refills | Status: DC
Start: 2015-10-26 — End: 2016-04-23

## 2015-10-26 MED ORDER — METFORMIN HCL 1000 MG PO TABS
1000 MG | ORAL_TABLET | Freq: Two times a day (BID) | ORAL | 1 refills | Status: DC
Start: 2015-10-26 — End: 2016-04-23

## 2015-10-26 MED ORDER — LISINOPRIL 20 MG PO TABS
20 MG | ORAL_TABLET | Freq: Every day | ORAL | 1 refills | Status: DC
Start: 2015-10-26 — End: 2016-04-23

## 2015-10-26 NOTE — Patient Instructions (Signed)
Learning About Diabetes Food Guidelines  Your Care Instructions  Meal planning is important to manage diabetes. It helps keep your blood sugar at a target level (which you set with your doctor). You don't have to eat special foods. You can eat what your family eats, including sweets once in a while. But you do have to pay attention to how often you eat and how much you eat of certain foods.  You may want to work with a dietitian or a certified diabetes educator (CDE) to help you plan meals and snacks. A dietitian or CDE can also help you lose weight if that is one of your goals.  What should you know about eating carbs?  Managing the amount of carbohydrate (carbs) you eat is an important part of healthy meals when you have diabetes. Carbohydrate is found in many foods.  ?? Learn which foods have carbs. And learn the amounts of carbs in different foods.  ?? Bread, cereal, pasta, and rice have about 15 grams of carbs in a serving. A serving is 1 slice of bread (1 ounce), ?? cup of cooked cereal, or 1/3 cup of cooked pasta or rice.  ?? Fruits have 15 grams of carbs in a serving. A serving is 1 small fresh fruit, such as an apple or orange; ?? of a banana; ?? cup of cooked or canned fruit; ?? cup of fruit juice; 1 cup of melon or raspberries; or 2 tablespoons of dried fruit.  ?? Milk and no-sugar-added yogurt have 15 grams of carbs in a serving. A serving is 1 cup of milk or 2/3 cup of no-sugar-added yogurt.  ?? Starchy vegetables have 15 grams of carbs in a serving. A serving is ?? cup of mashed potatoes or sweet potato; 1 cup winter squash; ?? of a small baked potato; ?? cup of cooked beans; or ?? cup cooked corn or green peas.  ?? Learn how much carbs to eat each day and at each meal. A dietitian or CDE can teach you how to keep track of the amount of carbs you eat. This is called carbohydrate counting.  ?? If you are not sure how to count carbohydrate grams, use the Plate Method to plan meals. It is a good, quick way to make  sure that you have a balanced meal. It also helps you spread carbs throughout the day.  ?? Divide your plate by types of foods. Put non-starchy vegetables on half the plate, meat or other protein food on one-quarter of the plate, and a grain or starchy vegetable in the final quarter of the plate. To this you can add a small piece of fruit and 1 cup of milk or yogurt, depending on how many carbs you are supposed to eat at a meal.  ?? Try to eat about the same amount of carbs at each meal. Do not "save up" your daily allowance of carbs to eat at one meal.  ?? Proteins have very little or no carbs per serving. Examples of proteins are beef, chicken, turkey, fish, eggs, tofu, cheese, cottage cheese, and peanut butter. A serving size of meat is 3 ounces, which is about the size of a deck of cards. Examples of meat substitute serving sizes (equal to 1 ounce of meat) are 1/4 cup of cottage cheese, 1 egg, 1 tablespoon of peanut butter, and ?? cup of tofu.  How can you eat out and still eat healthy?  ?? Learn to estimate the serving sizes of foods that have   carbohydrate. If you measure food at home, it will be easier to estimate the amount in a serving of restaurant food.  ?? If the meal you order has too much carbohydrate (such as potatoes, corn, or baked beans), ask to have a low-carbohydrate food instead. Ask for a salad or green vegetables.  ?? If you use insulin, check your blood sugar before and after eating out to help you plan how much to eat in the future.  ?? If you eat more carbohydrate at a meal than you had planned, take a walk or do other exercise. This will help lower your blood sugar.  What else should you know?  ?? Limit saturated fat, such as the fat from meat and dairy products. This is a healthy choice because people who have diabetes are at higher risk of heart disease. So choose lean cuts of meat and nonfat or low-fat dairy products. Use olive or canola oil instead of butter or shortening when cooking.  ?? Don't  skip meals. Your blood sugar may drop too low if you skip meals and take insulin or certain medicines for diabetes.  ?? Check with your doctor before you drink alcohol. Alcohol can cause your blood sugar to drop too low. Alcohol can also cause a bad reaction if you take certain diabetes medicines.  Follow-up care is a key part of your treatment and safety. Be sure to make and go to all appointments, and call your doctor if you are having problems. It's also a good idea to know your test results and keep a list of the medicines you take.  Where can you learn more?  Go to [email or web address] and sign in to your MyChart account. Enter I147 in the Search Health Information box to learn more about ???Learning About Diabetes Food Guidelines.???    If you do not have an account, please click on the ???Sign Up Now??? link.  ?? 2006-2016 [employer], [employer]. Care instructions adapted under license by [facility]. This care instruction is for use with your licensed healthcare professional. If you have questions about a medical condition or this instruction, always ask your healthcare professional. Health[employer], [employer] disclaims any warranty or liability for your use of this information.  Content Version: 11.0.578772; Current as of: Apr 02, 2015

## 2015-10-26 NOTE — Progress Notes (Signed)
Christopher [name] is a 39 y.o. male who presents for evaluation of hypertension, hyperlipidemia, and diabetes.Marland Kitchen He indicates that he is feeling well and denies any symptoms referable to his elevated blood pressure.   Specifically denies chest pain, palpitations, dyspnea, orthopnea, PND or peripheral edema.  No anorexia, arthralgia, or leg cramps noted. Current medication regimen is as listed below. He denies any side effects of medication, and has been taking it regularly. medication compliance:  compliant all of the time, diabetic diet compliance:  compliant most of the time, home glucose monitoring: are performed regularly, values range fasting 130-150, further diabetic ROS: no polyuria or polydipsia, no chest pain, dyspnea or TIAs, no numbness, tingling or pain in extremities, last eye exam approximately over 1 year ago.    ROS: No TIA's or unusual headaches, no dysphagia.  No prolonged cough. No dyspnea or chest pain on exertion.  No abdominal pain, change in bowel habits, black or bloody stools.  No urinary tract or BPH symptoms.  No new or unusual musculoskeletal symptoms.    No past medical history on file.  No past surgical history on file.      Current Outpatient Prescriptions   Medication Sig Dispense Refill   ??? atorvastatin (LIPITOR) 20 MG tablet Take 1 tablet by mouth daily 90 tablet 1   ??? metFORMIN (GLUCOPHAGE) 1000 MG tablet Take 1 tablet by mouth 2 times daily (with meals) 180 tablet 1   ??? lisinopril (PRINIVIL;ZESTRIL) 20 MG tablet Take 1 tablet by mouth daily 90 tablet 1   ??? ALPRAZolam (XANAX) 0.25 MG tablet Take 1 tab 30 minutes prior to flying 5 tablet 0   ??? atorvastatin (LIPITOR) 20 MG tablet Take 1 tablet by mouth daily 30 tablet 0   ??? lisinopril (PRINIVIL;ZESTRIL) 20 MG tablet Take 1 tablet by mouth daily 30 tablet 0   ??? metFORMIN (GLUCOPHAGE) 1000 MG tablet Take 1 tablet by mouth 2 times daily (with meals) 60 tablet 0   ??? Blood Glucose Monitoring Suppl (ACCU-CHEK AVIVA PLUS) W/DEVICE KIT   0    ??? ACCU-CHEK AVIVA PLUS strip   5   ??? Accu-Chek Softclix Lancets MISC   5     No current facility-administered medications for this visit.        No Known Allergies    Social History   Substance Use Topics   ??? Smoking status: Former Smoker   ??? Smokeless tobacco: Never Used      Comment: [place] 2011, but smoked since age 30,  2.5 ppd   ??? Alcohol use Yes          Objective:      Visit Vitals   ??? BP 118/74 (Site: Left Arm, Position: Sitting, Cuff Size: Medium Adult)   ??? Pulse 86   ??? Temp 97.2 ??F (36.2 ??C) (Temporal)   ??? Wt 221 lb 9.6 oz (100.5 kg)   ??? BMI 32.26 kg/m2     General: Alert and oriented, in no distress   S1 and S2 normal, no murmurs, clicks, gallops or rubs. Regular rate and rhythm. Chest is clear; no wheezes or rales. No edema or JVD.  heart sounds regular rate and rhythm, S1, S2 normal, no murmur, click, rub or gallop, chest clear, no hepatosplenomegaly, no carotid bruits, feet: normal DP and PT pulses, no trophic changes or ulcerative lesions and normal monofilament exam  A1c 7.1     Assessment:      Essential hypertension - well controlled and stable  Hyperlipidemia -  asymptomatic  Diabetes--asymptomatic and borderline controlled   Obesity - worsening     Plan:     pt to work on diet and execise  1)  Medication: continue current medication regimen unchanged  2)  Recheck in 3 months, sooner should new symptoms or problems arise.     Christopher [name] received counseling on the following healthy behaviors: nutrition, exercise and medication adherence    Patient given educational materials on Diabetes    I have instructed Christopher [name] to complete a self tracking handout on Blood Sugars  and instructed them to bring it with them to his next appointment.     Discussed use, benefit, and side effects of prescribed medications.  Barriers to medication compliance addressed.  All patient questions answered.  Pt voiced understanding.

## 2015-10-27 LAB — COMPREHENSIVE METABOLIC PANEL
ALT: 65 U/L — ABNORMAL HIGH (ref 0–60)
AST: 36 U/L (ref 0–46)
Albumin/Globulin Ratio: 2 (CALC) (ref 0.8–2.6)
Albumin: 4.7 GM/DL (ref 3.5–5.2)
Alkaline Phosphatase: 57 U/L (ref 23–144)
BUN/Creatinine Ratio: 12 (CALC) (ref 7.0–25.0)
BUN: 11 MG/DL (ref 3–29)
CO2: 25 MEQ/L (ref 19–32)
Calcium: 9.2 MG/DL (ref 8.5–10.5)
Chloride: 98 MEQ/L (ref 96–110)
Creatinine: 0.9 MG/DL (ref 0.5–1.4)
Est, Glom Filt Rate: 106 mL/min/{1.73_m2}
Globulin: 2.4 GM/DL (CALC) (ref 1.9–3.6)
Glucose: 142 MG/DL — ABNORMAL HIGH
Potassium: 4.2 MEQ/L (ref 3.4–5.3)
Sodium: 138 MEQ/L (ref 135–148)
Total Bilirubin: 1.5 MG/DL — ABNORMAL HIGH (ref 0.0–1.2)
Total Protein: 7.1 GM/DL (ref 6.0–8.3)

## 2015-12-08 LAB — HM DIABETES EYE EXAM

## 2016-01-24 ENCOUNTER — Ambulatory Visit
Admit: 2016-01-24 | Discharge: 2016-01-24 | Payer: BLUE CROSS/BLUE SHIELD | Attending: Family Medicine | Primary: Family Medicine

## 2016-01-24 DIAGNOSIS — E119 Type 2 diabetes mellitus without complications: Secondary | ICD-10-CM

## 2016-01-24 LAB — POCT GLYCOSYLATED HEMOGLOBIN (HGB A1C): Hemoglobin A1C: 6.7 %

## 2016-01-24 NOTE — Progress Notes (Signed)
Christopher [name] is a 40 y.o. male who presents for evaluation of hypertension, hyperlipidemia, and diabetes.Marland Kitchen He indicates that he is feeling well and denies any symptoms referable to his elevated blood pressure.   Specifically denies chest pain, palpitations, dyspnea, orthopnea, PND or peripheral edema.  No anorexia, arthralgia, or leg cramps noted. Current medication regimen is as listed below. He denies any side effects of medication, and has been taking it regularly. medication compliance:  compliant most of the time, diabetic diet compliance:  compliant most of the time, home glucose monitoring: are performed regularly, values range fasting 110s now, were higher earlier, further diabetic ROS: no polyuria or polydipsia, no chest pain, dyspnea or TIAs, no numbness, tingling or pain in extremities, last eye exam approximately 2 months ago.    Recently started exercising , both cardio and weight training.  At least 3 days per week.      Current Outpatient Prescriptions   Medication Sig Dispense Refill   ??? atorvastatin (LIPITOR) 20 MG tablet Take 1 tablet by mouth daily 90 tablet 1   ??? metFORMIN (GLUCOPHAGE) 1000 MG tablet Take 1 tablet by mouth 2 times daily (with meals) 180 tablet 1   ??? lisinopril (PRINIVIL;ZESTRIL) 20 MG tablet Take 1 tablet by mouth daily 90 tablet 1   ??? Blood Glucose Monitoring Suppl (ACCU-CHEK AVIVA PLUS) W/DEVICE KIT   0   ??? ACCU-CHEK AVIVA PLUS strip   5   ??? Accu-Chek Softclix Lancets MISC   5     No current facility-administered medications for this visit.        No Known Allergies    Social History   Substance Use Topics   ??? Smoking status: Former Smoker   ??? Smokeless tobacco: Never Used      Comment: [place] 2011, but smoked since age 40,  2.5 ppd   ??? Alcohol use Yes          Objective:      Visit Vitals   ??? BP 114/68 (Site: Left Arm, Position: Sitting, Cuff Size: Medium Adult)   ??? Pulse 88   ??? Wt 214 lb (97.1 kg)   ??? SpO2 98%   ??? BMI 31.15 kg/m2     General: Alert and oriented, in no  distress   S1 and S2 normal, no murmurs, clicks, gallops or rubs. Regular rate and rhythm. Chest is clear; no wheezes or rales. No edema or JVD.  heart sounds regular rate and rhythm, S1, S2 normal, no murmur, click, rub or gallop, chest clear, no hepatosplenomegaly, no carotid bruits, feet: normal DP and PT pulses, no trophic changes or ulcerative lesions and normal monofilament exam.  A1c 6.7     Assessment:      Essential hypertension - well controlled and stable  Hyperlipidemia - stable and asymptomatic  Diabetes--well controlled and stable     Plan:     Declines pneumovax  1)  Medication: continue current medication regimen unchanged  2)  Recheck in 3 months, sooner should new symptoms or problems arise.      Christopher [name] received counseling on the following healthy behaviors: nutrition, exercise and medication adherence    Patient given educational materials on Diabetes    I have instructed Christopher [name] to complete a self tracking handout on Blood Sugars  and instructed them to bring it with them to his next appointment.     Discussed use, benefit, and side effects of prescribed medications.  Barriers to medication compliance addressed.  All patient questions  answered.  Pt voiced understanding.

## 2016-04-23 ENCOUNTER — Ambulatory Visit
Admit: 2016-04-23 | Discharge: 2016-04-23 | Payer: BLUE CROSS/BLUE SHIELD | Attending: Family Medicine | Primary: Family Medicine

## 2016-04-23 DIAGNOSIS — E119 Type 2 diabetes mellitus without complications: Secondary | ICD-10-CM

## 2016-04-23 LAB — POCT GLYCOSYLATED HEMOGLOBIN (HGB A1C): Hemoglobin A1C: 6.5 %

## 2016-04-23 MED ORDER — LISINOPRIL 20 MG PO TABS
20 MG | ORAL_TABLET | Freq: Every day | ORAL | 1 refills | Status: DC
Start: 2016-04-23 — End: 2016-10-23

## 2016-04-23 MED ORDER — ATORVASTATIN CALCIUM 20 MG PO TABS
20 MG | ORAL_TABLET | Freq: Every day | ORAL | 1 refills | Status: DC
Start: 2016-04-23 — End: 2016-10-23

## 2016-04-23 MED ORDER — METFORMIN HCL 1000 MG PO TABS
1000 MG | ORAL_TABLET | Freq: Two times a day (BID) | ORAL | 1 refills | Status: DC
Start: 2016-04-23 — End: 2016-10-23

## 2016-04-23 NOTE — Patient Instructions (Signed)
Learning About Meal Planning for Diabetes  Why plan your meals?  Meal planning can be a key part of managing diabetes. Planning meals and snacks with the right balance of carbohydrate, protein, and fat can help you keep your blood sugar at the target level you set with your doctor.  You don't have to eat special foods. You can eat what your family eats, including sweets once in a while. But you do have to pay attention to how often you eat and how much you eat of certain foods.  You may want to work with a dietitian or a certified diabetes educator. He or she can give you tips and meal ideas and can answer your questions about meal planning. This health professional can also help you reach a healthy weight if that is one of your goals.  What plan is right for you?  Your dietitian or diabetes educator may suggest that you start with the plate format or carbohydrate counting.  The plate format  The plate format is a simple way to help you manage how you eat. You plan meals by learning how much space each food should take on a plate. Using the plate format helps you spread carbohydrate throughout the day. It can make it easier to keep your blood sugar level within your target range. It also helps you see if you're eating healthy portion sizes.  To use the plate format, you put non-starchy vegetables on half your plate. Add meat or meat substitutes on one-quarter of the plate. Put a grain or starchy vegetable (such as brown rice or a potato) on the final quarter of the plate. You can add a small piece of fruit and some low-fat or fat-free milk or yogurt, depending on your carbohydrate goal for each meal.  Here are some tips for using the plate format:  ?? Make sure that you are not using an oversized plate. A 9-inch plate is best. Many restaurants use larger plates.  ?? Get used to using the plate format at home. Then you can use it when you eat out.  ?? Write down your questions about using the plate format. Talk to  your doctor, a dietitian, or a diabetes educator about your concerns.  Carbohydrate counting  With carbohydrate counting, you plan meals based on the amount of carbohydrate in each food. Carbohydrate raises blood sugar higher and more quickly than any other nutrient. It is found in desserts, breads and cereals, and fruit. It's also found in starchy vegetables such as potatoes and corn, grains such as rice and pasta, and milk and yogurt. Spreading carbohydrate throughout the day helps keep your blood sugar levels within your target range.  Your daily amount depends on several things, including your weight, how active you are, which diabetes medicines you take, and what your goals are for your blood sugar levels. A registered dietitian or diabetes educator can help you plan how much carbohydrate to include in each meal and snack.  A guideline for your daily amount of carbohydrate is:  ?? 45 to 60 grams at each meal. That's about the same as 3 to 4 carbohydrate servings.  ?? 15 to 20 grams at each snack. That's about the same as 1 carbohydrate serving.  The Nutrition Facts label on packaged foods tells you how much carbohydrate is in a serving of the food. First, look at the serving size on the food label. Is that the amount you eat in a serving? All of the nutrition information   on a food label is based on that serving size. So if you eat more or less than that, you'll need to adjust the other numbers. Total carbohydrate is the next thing you need to look for on the label. If you count carbohydrate servings, one serving of carbohydrate is 15 grams.  For foods that don't come with labels, such as fresh fruits and vegetables, you'll need a guide that lists carbohydrate in these foods. Ask your doctor, dietitian, or diabetes educator about books or other nutrition guides you can use.  If you take insulin, you need to know how many grams of carbohydrate are in a meal. This lets you know how much rapid-acting insulin to take  before you eat. If you use an insulin pump, you get a constant rate of insulin during the day. So the pump must be programmed at meals to give you extra insulin to cover the rise in blood sugar after meals.  When you know how much carbohydrate you will eat, you can take the right amount of insulin. Or, if you always use the same amount of insulin, you need to make sure that you eat the same amount of carbohydrate at meals.  If you need more help to understand carbohydrate counting and food labels, ask your doctor, dietitian, or diabetes educator.  How do you get started with meal planning?  Here are some tips to get started:  ?? Plan your meals a week at a time. Don't forget to include snacks too.  ?? Use cookbooks or online recipes to plan several main meals. Plan some quick meals for busy nights. You also can double some recipes that freeze well. Then you can save half for other busy nights when you don't have time to cook.  ?? Make sure you have the ingredients you need for your recipes. If you're running low on basic items, put these items on your shopping list too.  ?? List foods that you use to make breakfasts, lunches, and snacks. List plenty of fruits and vegetables.  ?? Post this list on the refrigerator. Add to it as you think of more things you need.  ?? Take the list to the store to do your weekly shopping.  Follow-up care is a key part of your treatment and safety. Be sure to make and go to all appointments, and call your doctor if you are having problems. It's also a good idea to know your test results and keep a list of the medicines you take.  Where can you learn more?  Go to [email or web address] and sign in to your MyChart account. Enter X936 in the Search Health Information box to learn more about "Learning About Meal Planning for Diabetes."     If you do not have an account, please click on the "Sign Up Now" link.  Current as of: September 05, 2015  Content Version: 11.2  ?? 2006-2017  [employer]. Care instructions adapted under license by [facility]. If you have questions about a medical condition or this instruction, always ask your healthcare professional. [employer] disclaims any warranty or liability for your use of this information.

## 2016-04-23 NOTE — Progress Notes (Signed)
Christopher [name] is a 40 y.o. male who presents for evaluation of hypertension, hyperlipidemia, and diabetes.Marland Kitchen He indicates that he is feeling well and denies any symptoms referable to his elevated blood pressure.   Specifically denies chest pain, palpitations, dyspnea, orthopnea, PND or peripheral edema.  No anorexia, arthralgia, or leg cramps noted. Current medication regimen is as listed below. He denies any side effects of medication, and has been taking it regularly. medication compliance:  compliant all of the time, diabetic diet compliance:  compliant most of the time, home glucose monitoring: are performed regularly, values range 90-126, further diabetic ROS: no polyuria or polydipsia, no chest pain, dyspnea or TIAs, no numbness, tingling or pain in extremities, last eye exam approximately 5 months ago.      Current Outpatient Prescriptions   Medication Sig Dispense Refill   ??? atorvastatin (LIPITOR) 20 MG tablet Take 1 tablet by mouth daily 90 tablet 1   ??? metFORMIN (GLUCOPHAGE) 1000 MG tablet Take 1 tablet by mouth 2 times daily (with meals) 180 tablet 1   ??? lisinopril (PRINIVIL;ZESTRIL) 20 MG tablet Take 1 tablet by mouth daily 90 tablet 1   ??? Blood Glucose Monitoring Suppl (ACCU-CHEK AVIVA PLUS) W/DEVICE KIT   0   ??? ACCU-CHEK AVIVA PLUS strip   5   ??? Accu-Chek Softclix Lancets MISC   5     No current facility-administered medications for this visit.        No Known Allergies    Social History   Substance Use Topics   ??? Smoking status: Former Smoker   ??? Smokeless tobacco: Never Used      Comment: [place] 2011, but smoked since age 75,  2.5 ppd   ??? Alcohol use Yes          Objective:      BP 130/72 (Site: Left Arm, Position: Sitting, Cuff Size: Medium Adult)   Pulse 61   Temp 96.8 ??F (36 ??C) (Temporal)    Wt 208 lb (94.3 kg)   SpO2 98%   BMI 30.28 kg/m2  General: Alert and oriented, in no distress   S1 and S2 normal, no murmurs, clicks, gallops or rubs. Regular rate and rhythm. Chest is clear; no wheezes or  rales. No edema or JVD.  heart sounds regular rate and rhythm, S1, S2 normal, no murmur, click, rub or gallop, chest clear, no hepatosplenomegaly, no carotid bruits, feet: normal DP and PT pulses, no trophic changes or ulcerative lesions and normal monofilament exam  A1c 6.5     Assessment:      Essential hypertension - well controlled and stable  Hyperlipidemia - asymptomatic  Diabetes--well controlled and stable     Plan:     patient declines pneumovax  1)  Medication: continue current medication regimen unchanged  2)  Recheck in 6 months, sooner should new symptoms or problems arise.     Christopher [name] received counseling on the following healthy behaviors: nutrition, exercise and medication adherence    Patient given educational materials on Diabetes    I have instructed Christopher [name] to complete a self tracking handout on Blood Sugars  and instructed them to bring it with them to his next appointment.     Discussed use, benefit, and side effects of prescribed medications.  Barriers to medication compliance addressed.  All patient questions answered.  Pt voiced understanding.

## 2016-04-24 LAB — MICROALBUMIN / CREATININE URINE RATIO
Creatinine: 131.4 MG/DL (ref >20)
Microalb, Ur: 470 ug/dL
Microalbumin Creatinine Ratio: 4 MCG/MG CREAT (ref 0–30)

## 2016-04-24 LAB — COMPREHENSIVE METABOLIC PANEL
ALT: 35 U/L (ref 0–60)
AST: 28 U/L (ref 0–46)
Albumin/Globulin Ratio: 2.5 (CALC) (ref 0.8–2.6)
Albumin: 4.7 GM/DL (ref 3.5–5.2)
Alkaline Phosphatase: 56 U/L (ref 23–144)
BUN/Creatinine Ratio: 16 (CALC) (ref 7–25)
BUN: 13 MG/DL (ref 3–29)
CO2: 26 MEQ/L (ref 19–32)
Calcium: 9.5 MG/DL (ref 8.5–10.5)
Chloride: 101 MEQ/L (ref 96–110)
Creatinine: 0.8 MG/DL
Est, Glom Filt Rate: 112 mL/min/{1.73_m2}
Globulin: 1.9 GM/DL (CALC) (ref 1.9–3.6)
Glucose: 133 MG/DL — ABNORMAL HIGH
Potassium: 4.2 MEQ/L (ref 3.4–5.3)
Sodium: 141 MEQ/L (ref 135–148)
Total Bilirubin: 0.8 MG/DL (ref 0.0–1.2)
Total Protein: 6.6 GM/DL (ref 6.0–8.3)

## 2016-04-24 LAB — LIPID PANEL
Cholesterol, Total: 90 MG/DL
HDL: 25 MG/DL — ABNORMAL LOW
LDL Cholesterol: 25 MG/DL (CALC)
Triglycerides: 201 MG/DL — ABNORMAL HIGH
VLDL: 40 MG/DL (CALC) — ABNORMAL HIGH (ref 4–32)

## 2016-04-24 LAB — $CPTO - HB

## 2016-10-23 ENCOUNTER — Ambulatory Visit
Admit: 2016-10-23 | Discharge: 2016-10-23 | Payer: BLUE CROSS/BLUE SHIELD | Attending: Family Medicine | Primary: Family Medicine

## 2016-10-23 DIAGNOSIS — E119 Type 2 diabetes mellitus without complications: Secondary | ICD-10-CM

## 2016-10-23 LAB — POCT GLYCOSYLATED HEMOGLOBIN (HGB A1C): Hemoglobin A1C: 7.4 %

## 2016-10-23 MED ORDER — ATORVASTATIN CALCIUM 20 MG PO TABS
20 MG | ORAL_TABLET | Freq: Every day | ORAL | 1 refills | Status: DC
Start: 2016-10-23 — End: 2017-05-28

## 2016-10-23 MED ORDER — METFORMIN HCL 1000 MG PO TABS
1000 MG | ORAL_TABLET | Freq: Two times a day (BID) | ORAL | 1 refills | Status: DC
Start: 2016-10-23 — End: 2017-06-05

## 2016-10-23 MED ORDER — LISINOPRIL 20 MG PO TABS
20 MG | ORAL_TABLET | Freq: Every day | ORAL | 1 refills | Status: DC
Start: 2016-10-23 — End: 2017-06-05

## 2016-10-23 NOTE — Progress Notes (Signed)
Christopher [name] is a 40 y.o. male who presents for evaluation of hypertension, hyperlipidemia, and diabetes.Marland Kitchen He indicates that he is feeling well and denies any symptoms referable to his elevated blood pressure.   Specifically denies chest pain, palpitations, dyspnea, orthopnea, PND or peripheral edema.  No anorexia, arthralgia, or leg cramps noted. Current medication regimen is as listed below. He denies any side effects of medication, and has been taking it regularly. medication compliance:  compliant all of the time, diabetic diet compliance:  compliant most of the time, home glucose monitoring: are performed regularly, values range 140s fasting, further diabetic ROS: no polyuria or polydipsia, no chest pain, dyspnea or TIAs, no numbness, tingling or pain in extremities, last eye exam approximately 11 months ago.    ROS: No TIA's or unusual headaches, no dysphagia.  No prolonged cough. No dyspnea or chest pain on exertion.  No abdominal pain, change in bowel habits, black or bloody stools.  No urinary tract or BPH symptoms.  No new or unusual musculoskeletal symptoms.      Current Outpatient Prescriptions   Medication Sig Dispense Refill   ??? atorvastatin (LIPITOR) 20 MG tablet Take 1 tablet by mouth daily 90 tablet 1   ??? metFORMIN (GLUCOPHAGE) 1000 MG tablet Take 1 tablet by mouth 2 times daily (with meals) 180 tablet 1   ??? lisinopril (PRINIVIL;ZESTRIL) 20 MG tablet Take 1 tablet by mouth daily 90 tablet 1   ??? Blood Glucose Monitoring Suppl (ACCU-CHEK AVIVA PLUS) W/DEVICE KIT   0   ??? ACCU-CHEK AVIVA PLUS strip   5   ??? Accu-Chek Softclix Lancets MISC   5     No current facility-administered medications for this visit.        No Known Allergies    Social History   Substance Use Topics   ??? Smoking status: Former Smoker   ??? Smokeless tobacco: Never Used      Comment: [place] 2011, but smoked since age 39,  2.5 ppd   ??? Alcohol use Yes          Objective:      BP 122/82 (Site: Left Arm, Position: Sitting, Cuff Size:  Medium Adult)    Pulse 63    Ht '5\' 10"'  (1.778 m)    Wt 223 lb (101.2 kg)    SpO2 97%    BMI 32.00 kg/m??   General: Alert and oriented, in no distress   S1 and S2 normal, no murmurs, clicks, gallops or rubs. Regular rate and rhythm. Chest is clear; no wheezes or rales. No edema or JVD.  heart sounds regular rate and rhythm, S1, S2 normal, no murmur, click, rub or gallop, chest clear, no hepatosplenomegaly, no carotid bruits, feet: normal DP and PT pulses, normal monofilament exam and trophic changes calluses bilateral great toes.    A1c 7.4  Assessment:      Essential hypertension - well controlled and stable  Hyperlipidemia - asymptomatic  Diabetes--asymptomatic and borderline controlled     Plan:     Will work on diet and exercise for three months  1)  Medication: continue current medication regimen unchanged  2)  Recheck in 6 months, sooner should new symptoms or problems arise.    patient declines Adacel, influenza, and pneumonia vaccines.    Jax received counseling on the following healthy behaviors: nutrition, exercise and medication adherence    Patient given educational materials on Diabetes    I have instructed Jeanluc to complete a self tracking handout on  Blood Sugars  and instructed them to bring it with them to his next appointment.     Discussed use, benefit, and side effects of prescribed medications.  Barriers to medication compliance addressed.  All patient questions answered.  Pt voiced understanding.     Follow-up 3 months.

## 2016-11-05 LAB — COMPREHENSIVE METABOLIC PANEL
ALT: 48 U/L (ref 0–60)
AST: 22 U/L (ref 0–46)
Albumin/Globulin Ratio: 2.3 (CALC) (ref 0.8–2.6)
Albumin: 4.9 GM/DL (ref 3.5–5.2)
Alkaline Phosphatase: 62 U/L (ref 23–144)
BUN/Creatinine Ratio: 18 (CALC) (ref 7–25)
BUN: 16 MG/DL (ref 3–29)
CO2: 29 MEQ/L (ref 19–32)
Calcium: 9.9 MG/DL (ref 8.5–10.5)
Chloride: 102 MEQ/L (ref 96–110)
Creatinine: 0.9 MG/DL
Est, Glom Filt Rate: 106 mL/min/{1.73_m2}
Globulin: 2.1 GM/DL (CALC) (ref 1.9–3.6)
Glucose: 178 MG/DL — ABNORMAL HIGH
Potassium: 4.4 MEQ/L (ref 3.4–5.3)
Sodium: 141 MEQ/L (ref 135–148)
Total Bilirubin: 0.8 MG/DL (ref 0.0–1.2)
Total Protein: 7 GM/DL (ref 6.0–8.3)

## 2016-11-05 LAB — $CPTO - HB

## 2017-03-30 ENCOUNTER — Ambulatory Visit: Admit: 2017-03-30 | Discharge: 2017-03-30 | Payer: BLUE CROSS/BLUE SHIELD | Attending: Family | Primary: Family Medicine

## 2017-03-30 DIAGNOSIS — J069 Acute upper respiratory infection, unspecified: Secondary | ICD-10-CM

## 2017-03-30 MED ORDER — AMOXICILLIN-POT CLAVULANATE 875-125 MG PO TABS
875-125 MG | ORAL_TABLET | Freq: Two times a day (BID) | ORAL | 0 refills | Status: DC
Start: 2017-03-30 — End: 2017-04-08

## 2017-03-30 MED ORDER — AZELASTINE HCL 0.1 % NA SOLN
0.1 % | Freq: Two times a day (BID) | NASAL | 0 refills | Status: DC
Start: 2017-03-30 — End: 2017-06-05

## 2017-03-30 NOTE — Progress Notes (Signed)
Christopher [name]   41 y.o.  male  [id number]      Chief Complaint   Patient presents with   . Head Congestion     x 1 week ago, he took Coricidin and Halls cough drops. He has a hard time sleeping with the coughing. Cough is non-productive.         Subjective:  40 y.o.male is here for a follow up. He has the following chronic/acute medical problems:   Patient Active Problem List   Diagnosis   . HTN, goal below 140/90   . Hyperlipidemia associated with type 2 diabetes mellitus ([place])   . Anxiety   . Non morbid obesity due to excess calories     URI    This is a new problem. The current episode started 1 to 4 weeks ago (1 week). The problem has been gradually worsening. The maximum temperature recorded prior to his arrival was 101 - 101.9 F. Associated symptoms include congestion (nasal congestion), coughing, ear pain (pain two days ago fullness at this time), a plugged ear sensation, rhinorrhea, sneezing and a sore throat. Pertinent negatives include no chest pain, diarrhea, dysuria, headaches, nausea, rash, sinus pain, vomiting or wheezing. Treatments tried: Coriciden/ Halls cough drops. The treatment provided no relief.         Review of Systems   Constitutional: Positive for chills, fatigue and fever (two days ago). Negative for appetite change.   HENT: Positive for congestion (nasal congestion), ear pain (pain two days ago fullness at this time), rhinorrhea, sneezing and sore throat. Negative for ear discharge, postnasal drip, sinus pain and sinus pressure.    Respiratory: Positive for cough. Negative for chest tightness, shortness of breath and wheezing.    Cardiovascular: Negative for chest pain and palpitations.   Gastrointestinal: Negative for diarrhea, nausea and vomiting.   Genitourinary: Negative for dysuria.   Skin: Negative for rash.   Neurological: Positive for dizziness (little bit yesterday). Negative for light-headedness and headaches.       Current Outpatient Prescriptions   Medication Sig Dispense  Refill   . Chlorphen-Pseudoephed-APAP (CORICIDIN D PO) Take by mouth as needed     . Menthol (HALLS COUGH DROPS MT) Take by mouth as needed     . amoxicillin-clavulanate (AUGMENTIN) 875-125 MG per tablet Take 1 tablet by mouth every 12 hours for 10 days 20 tablet 0   . azelastine (ASTELIN) 0.1 % nasal spray 2 sprays by Nasal route 2 times daily Use in each nostril as directed 1 Bottle 0   . atorvastatin (LIPITOR) 20 MG tablet Take 1 tablet by mouth daily 90 tablet 1   . metFORMIN (GLUCOPHAGE) 1000 MG tablet Take 1 tablet by mouth 2 times daily (with meals) 180 tablet 1   . lisinopril (PRINIVIL;ZESTRIL) 20 MG tablet Take 1 tablet by mouth daily 90 tablet 1   . Blood Glucose Monitoring Suppl (ACCU-CHEK AVIVA PLUS) W/DEVICE KIT   0   . ACCU-CHEK AVIVA PLUS strip   5   . Accu-Chek Softclix Lancets MISC   5     No current facility-administered medications for this visit.         Past medical, family,surgical history reviewed today.     Objective:  BP 108/70   Pulse 90   Temp 98.7 F (37.1 C) (Oral)   Ht _0  (1.753 m)   Wt 209 lb 3.2 oz (94.9 kg)   SpO2 98%   BMI 30.89 kg/m   BP Readings from Last  3 Encounters:   03/30/17 108/70   10/23/16 122/82   04/23/16 130/72     Wt Readings from Last 3 Encounters:   03/30/17 209 lb 3.2 oz (94.9 kg)   10/23/16 223 lb (101.2 kg)   04/23/16 208 lb (94.3 kg)         Physical Exam   Constitutional: He is oriented to person, place, and time. He appears well-developed and well-nourished.   HENT:   Head: Normocephalic.   Right Ear: Tympanic membrane, external ear and ear canal normal.   Left Ear: External ear and ear canal normal. Tympanic membrane is erythematous.   Neck: Neck supple.   Cardiovascular: Normal rate, regular rhythm and normal heart sounds.    Pulmonary/Chest: Effort normal and breath sounds normal.   Neurological: He is alert and oriented to person, place, and time.   Skin: Skin is warm and dry.   Psychiatric: He has a normal mood and affect. His behavior is  normal.       No results found for: WBC, HGB, HCT, MCV, PLT  Lab Results   Component Value Date    NA 141 11/05/2016    K 4.4 11/05/2016    CL 102 11/05/2016    CO2 29 11/05/2016    BUN 16 11/05/2016    CREATININE 0.9 11/05/2016    GLUCOSE 178 (H) 11/05/2016    CALCIUM 9.9 11/05/2016    PROT 7.0 11/05/2016    LABALBU 4.9 11/05/2016    BILITOT 0.8 11/05/2016    ALKPHOS 62 11/05/2016    AST 22 11/05/2016    ALT 48 11/05/2016    LABGLOM 106 11/05/2016    GFRAA >60 05/29/2014    AGRATIO 2.3 11/05/2016    GLOB 2.1 11/05/2016     Lab Results   Component Value Date    CHOL 90 04/23/2016    CHOL 116 04/20/2015    CHOL 102 09/21/2014     Lab Results   Component Value Date    TRIG 201 (H) 04/23/2016    TRIG 144 04/20/2015    TRIG 112 09/21/2014     Lab Results   Component Value Date    HDL 25 (L) 04/23/2016    HDL 31 (L) 04/20/2015    HDL 36 (L) 09/21/2014     Lab Results   Component Value Date    LDLCHOLESTEROL 25 04/23/2016    LDLCHOLESTEROL 56 04/20/2015    LDLCHOLESTEROL 44 09/21/2014     Lab Results   Component Value Date    LABA1C 7.4 10/23/2016     No results found for: TSHFT4, TSH, TSHHS      ASSESSMENT/PLAN:      1. Upper respiratory tract infection, unspecified type  Encourage clear fluids without caffeine  - Smaller, more frequent meals to ensure hydration.   - Saline nasal spray, cool mist humidifier, prop head at night for congestion.   - May use spoonfuls of honey to coat throat.   - Tylenol as needed for fever, pain.   - Counseled on signs of increased work of breathing.   - RTO if sxs increase or no improvement  - amoxicillin-clavulanate (AUGMENTIN) 875-125 MG per tablet; Take 1 tablet by mouth every 12 hours for 10 days  Dispense: 20 tablet; Refill: 0  - azelastine (ASTELIN) 0.1 % nasal spray; 2 sprays by Nasal route 2 times daily Use in each nostril as directed  Dispense: 1 Bottle; Refill: 0    2. Left otitis media, unspecified otitis media  type  - amoxicillin-clavulanate (AUGMENTIN) 875-125 MG per tablet;  Take 1 tablet by mouth every 12 hours for 10 days  Dispense: 20 tablet; Refill: 0          There are no discontinued medications.    No orders of the defined types were placed in this encounter.      Care discussed with patient. Questions answered. Patient verbalizes understanding and agrees with plan.   After visit summary provided.   Advised to call for any problems, questions, or concerns.  Patient will be contacted in two to three days to check on progress.         Return if symptoms worsen or fail to improve.                                             Signed:  Newman Nip, APRN - CNP  03/30/17  5:50 PM

## 2017-03-30 NOTE — Patient Instructions (Signed)
We are committed to providing you the best care possible.    If you receive a survey after visiting one of our offices, please take time to share your experience concerning your physician office visit.  These surveys are confidential and no health information about you is shared.    We are eager to improve for you and we are counting on your feedback to help make that happen.

## 2017-04-08 ENCOUNTER — Ambulatory Visit: Admit: 2017-04-08 | Discharge: 2017-04-08 | Payer: BLUE CROSS/BLUE SHIELD | Attending: Family | Primary: Family Medicine

## 2017-04-08 DIAGNOSIS — H66005 Acute suppurative otitis media without spontaneous rupture of ear drum, recurrent, left ear: Secondary | ICD-10-CM

## 2017-04-08 MED ORDER — CEFDINIR 300 MG PO CAPS
300 MG | ORAL_CAPSULE | Freq: Two times a day (BID) | ORAL | 0 refills | Status: AC
Start: 2017-04-08 — End: 2017-04-18

## 2017-04-08 MED ORDER — CEFDINIR 300 MG PO CAPS
300 MG | ORAL_CAPSULE | Freq: Two times a day (BID) | ORAL | 0 refills | Status: DC
Start: 2017-04-08 — End: 2017-04-08

## 2017-04-08 NOTE — Patient Instructions (Addendum)
We are committed to providing you the best care possible.    If you receive a survey after visiting one of our offices, please take time to share your experience concerning your physician office visit.  These surveys are confidential and no health information about you is shared.    We are eager to improve for you and we are counting on your feedback to help make that happen.      Patient Education        Ear Infection (Otitis Media): Care Instructions  Your Care Instructions    An ear infection may start with a cold and affect the middle ear (otitis media). It can hurt a lot. Most ear infections clear up on their own in a couple of days. Most often you will not need antibiotics. This is because many ear infections are caused by a virus. Antibiotics don't work against a virus. Regular doses of pain medicines are the best way to reduce your fever and help you feel better.  Follow-up care is a key part of your treatment and safety. Be sure to make and go to all appointments, and call your doctor if you are having problems. It's also a good idea to know your test results and keep a list of the medicines you take.  How can you care for yourself at home?   Take pain medicines exactly as directed.   If the doctor gave you a prescription medicine for pain, take it as prescribed.   If you are not taking a prescription pain medicine, take an over-the-counter medicine, such as acetaminophen (Tylenol), ibuprofen (Advil, Motrin), or naproxen (Aleve). Read and follow all instructions on the label.   Do not take two or more pain medicines at the same time unless the doctor told you to. Many pain medicines have acetaminophen, which is Tylenol. Too much acetaminophen (Tylenol) can be harmful.   Plan to take a full dose of pain reliever before bedtime. Getting enough sleep will help you get better.   Try a warm, moist washcloth on the ear. It may help relieve pain.   If your doctor prescribed antibiotics, take them as  directed. Do not stop taking them just because you feel better. You need to take the full course of antibiotics.  When should you call for help?  Call your doctor now or seek immediate medical care if:    You have new or increasing ear pain.     You have new or increasing pus or blood draining from your ear.     You have a fever with a stiff neck or a severe headache.   Watch closely for changes in your health, and be sure to contact your doctor if:    You have new or worse symptoms.     You are not getting better after taking an antibiotic for 2 days.   Where can you learn more?  Go to [email or web address] and sign in to your MyChart account. Enter X558 in the Search Health Information box to learn more about "Ear Infection (Otitis Media): Care Instructions."     If you do not have an account, please click on the "Sign Up Now" link.  Current as of: Mar 21, 2016  Content Version: 11.6   2006-2018 [employer]. Care instructions adapted under license by [facility]. If you have questions about a medical condition or this instruction, always ask your healthcare professional. [employer], Incorporated disclaims any warranty or liability for your use of this information.

## 2017-04-08 NOTE — Progress Notes (Signed)
Christopher [name]  Jul 25, 1976  41 y.o.    SUBJECT IVE:    Chief Complaint   Patient presents with   . Otalgia     Left ear is painful and fullness, right ear is cracking. He finished his atb prescribed by Roselyn Reef on 03/30/17.        Seen 03/30/17 at [facility] for URI and left otitis media. He was started on a course of - amoxicillin-clavulanate (AUGMENTIN) 875-125 MG per tablet; Take 1 tablet by mouth every 12 hours for 10 days. He has one day left with resolved URI but unfortunately worsening left  otitis media.Was to follow up on Wednesday but was on vacation. Left ear is full, decreased hearing, denies drainage from the left ear. Has ringing in the right ear and when yawning has popping and cracking. Denies fever or chills.        No past medical history on file.    Current Outpatient Prescriptions on File Prior to Visit   Medication Sig Dispense Refill   . Chlorphen-Pseudoephed-APAP (CORICIDIN D PO) Take by mouth as needed     . azelastine (ASTELIN) 0.1 % nasal spray 2 sprays by Nasal route 2 times daily Use in each nostril as directed 1 Bottle 0   . atorvastatin (LIPITOR) 20 MG tablet Take 1 tablet by mouth daily 90 tablet 1   . metFORMIN (GLUCOPHAGE) 1000 MG tablet Take 1 tablet by mouth 2 times daily (with meals) 180 tablet 1   . lisinopril (PRINIVIL;ZESTRIL) 20 MG tablet Take 1 tablet by mouth daily 90 tablet 1   . Blood Glucose Monitoring Suppl (ACCU-CHEK AVIVA PLUS) W/DEVICE KIT   0   . ACCU-CHEK AVIVA PLUS strip   5   . Accu-Chek Softclix Lancets MISC   5     No current facility-administered medications on file prior to visit.      Review of Systems   Constitutional: Negative for diaphoresis, fatigue and fever.   HENT: Positive for ear pain and tinnitus. Negative for congestion, ear discharge, postnasal drip, sinus pain, sinus pressure, sneezing, sore throat and trouble swallowing.    Eyes: Negative for photophobia, pain, discharge, redness, itching and visual disturbance.    Respiratory: Negative for cough, chest tightness, shortness of breath and wheezing.    Cardiovascular: Negative for chest pain and palpitations.   Gastrointestinal: Negative for abdominal pain, constipation, diarrhea, nausea and vomiting.   Neurological: Positive for headaches. Negative for dizziness, light-headedness and numbness.       OBJECTIVE:     BP 98/62   Pulse 74   Temp 98.1 F (36.7 C) (Oral)   Wt 207 lb 9.6 oz (94.2 kg)   SpO2 98%   BMI 30.66 kg/m     Physical Exam   Constitutional: He is oriented to person, place, and time. He appears well-developed and well-nourished. No distress.   HENT:   Head: Normocephalic.   Right Ear: Tympanic membrane is erythematous.   Left Ear: There is swelling. Tympanic membrane is erythematous. A middle ear effusion is present. Decreased hearing is noted.   Ears:    Nose: Nose normal.   Mouth/Throat: Uvula is midline, oropharynx is clear and moist and mucous membranes are normal.   Eyes: Conjunctivae, EOM and lids are normal. Pupils are equal, round, and reactive to light. Right eye exhibits no discharge. Left eye exhibits no discharge.   Neck: Normal range of motion. Neck supple.   Cardiovascular: Normal rate, regular rhythm and normal heart  sounds.    Pulmonary/Chest: Effort normal and breath sounds normal. No respiratory distress.   Abdominal: Soft.   Lymphadenopathy:        Head (right side): No submental and no submandibular adenopathy present.        Head (left side): No submental and no submandibular adenopathy present.   Neurological: He is alert and oriented to person, place, and time.   Skin: Skin is warm.       No results found for requested labs within last 30 days.     Hemoglobin A1C (%)   Date Value   10/23/2016 7.4       No results found for: WBC, NEUTROABS, HGB, HCT, MCV, PLT, SEGSABS, LYMPHSABS, MONOSABS, EOSABS, BASOSABS  No results found for: TSH, TSHHS  Lab Results   Component Value Date    LABALBU 4.9 11/05/2016    BILITOT 0.8 11/05/2016     BILIDIR 0.2 09/21/2014    IBILI 0.6 09/21/2014    AST 22 11/05/2016    ALT 48 11/05/2016    ALKPHOS 62 11/05/2016      ASSESSMENT AND PLAN:     1. Recurrent acute suppurative otitis media without spontaneous rupture of left tympanic membrane  -drink plenty of fluids  -ibuprofen per package label as needed for ear pain/discomfort  -take ibuprofen with food  - cefdinir (OMNICEF) 300 MG capsule; Take 1 capsule by mouth 2 times daily for 10 days  Dispense: 20 capsule; Refill: 0  - consider ENT referral d/t recurrent OM; pt declined at this time.     Return if symptoms worsen or fail to improve.    Care discussed with patient. Questions answered and patient verbalizes understanding and agrees with plan.     Medications reviewed and reconciled. Continue current medications.  Appropriate prescriptions are ordered. Risks and benefits of meds are discussed.     After visit summary provided.      Advised to call for any problems, questions, or concerns. Patient educated on signs and symptoms of exacerbation and when to seek further medical attention. Patient verbalized understanding of these signs and symptoms as well as the plan of care.

## 2017-05-28 ENCOUNTER — Encounter

## 2017-05-28 MED ORDER — ATORVASTATIN CALCIUM 20 MG PO TABS
20 | ORAL_TABLET | Freq: Every day | ORAL | 0 refills | Status: DC
Start: 2017-05-28 — End: 2017-06-05

## 2017-05-28 NOTE — Telephone Encounter (Signed)
Patient has an appointment with you next Friday but only has two days left of his Lipitor, could you call bridge script in please?

## 2017-06-05 ENCOUNTER — Ambulatory Visit
Admit: 2017-06-05 | Discharge: 2017-06-05 | Payer: BLUE CROSS/BLUE SHIELD | Attending: Family Medicine | Primary: Family Medicine

## 2017-06-05 DIAGNOSIS — E119 Type 2 diabetes mellitus without complications: Secondary | ICD-10-CM

## 2017-06-05 LAB — POCT GLYCOSYLATED HEMOGLOBIN (HGB A1C): Hemoglobin A1C: 7.2 %

## 2017-06-05 MED ORDER — LISINOPRIL 20 MG PO TABS
20 | ORAL_TABLET | Freq: Every day | ORAL | 1 refills | Status: DC
Start: 2017-06-05 — End: 2017-08-31

## 2017-06-05 MED ORDER — ATORVASTATIN CALCIUM 20 MG PO TABS
20 | ORAL_TABLET | Freq: Every day | ORAL | 1 refills | Status: DC
Start: 2017-06-05 — End: 2017-08-31

## 2017-06-05 MED ORDER — METFORMIN HCL 1000 MG PO TABS
1000 | ORAL_TABLET | Freq: Two times a day (BID) | ORAL | 1 refills | Status: DC
Start: 2017-06-05 — End: 2017-08-31

## 2017-06-05 MED ORDER — PIOGLITAZONE HCL 30 MG PO TABS
30 MG | ORAL_TABLET | Freq: Every day | ORAL | 1 refills | Status: DC
Start: 2017-06-05 — End: 2017-06-22

## 2017-06-05 NOTE — Progress Notes (Signed)
Christopher [name] is a 41 y.o. male who presents for evaluation of hypertension, hyperlipidemia, and diabetes.Marland Kitchen He indicates that he is feeling well and denies any symptoms referable to his elevated blood pressure.   Specifically denies chest pain, palpitations, dyspnea, orthopnea, PND or peripheral edema.  No anorexia, arthralgia, or leg cramps noted. Current medication regimen is as listed below. He denies any side effects of medication, and has been taking it regularly. medication compliance:  compliant all of the time, diabetic diet compliance:  compliant most of the time, home glucose monitoring: are performed regularly, values range 140s, further diabetic ROS: no polyuria or polydipsia, no chest pain, dyspnea or TIAs, no numbness, tingling or pain in extremities, last eye exam approximately 1 year ago.    ROS: No TIA's or unusual headaches, no dysphagia.  No prolonged cough. No dyspnea or chest pain on exertion.  No abdominal pain, change in bowel habits, black or bloody stools.  No urinary tract or BPH symptoms.  No new or unusual musculoskeletal symptoms.      Current Outpatient Prescriptions   Medication Sig Dispense Refill   . atorvastatin (LIPITOR) 20 MG tablet Take 1 tablet by mouth daily 30 tablet 0   . Chlorphen-Pseudoephed-APAP (CORICIDIN D PO) Take by mouth as needed     . azelastine (ASTELIN) 0.1 % nasal spray 2 sprays by Nasal route 2 times daily Use in each nostril as directed 1 Bottle 0   . metFORMIN (GLUCOPHAGE) 1000 MG tablet Take 1 tablet by mouth 2 times daily (with meals) 180 tablet 1   . lisinopril (PRINIVIL;ZESTRIL) 20 MG tablet Take 1 tablet by mouth daily 90 tablet 1   . Blood Glucose Monitoring Suppl (ACCU-CHEK AVIVA PLUS) W/DEVICE KIT   0   . ACCU-CHEK AVIVA PLUS strip   5   . Accu-Chek Softclix Lancets MISC   5     No current facility-administered medications for this visit.        No Known Allergies    Social History   Substance Use Topics   . Smoking status: Former Research scientist (life sciences)   .  Smokeless tobacco: Never Used      Comment: [place] 2011, but smoked since age 59,  2.5 ppd   . Alcohol use Yes          Objective:      BP 118/76 (Site: Left Arm, Position: Sitting, Cuff Size: Medium Adult)   Pulse 64   Temp 98.1 F (36.7 C) (Temporal)   Wt 213 lb (96.6 kg)   SpO2 91%   BMI 31.45 kg/m   General: Alert and oriented, in no distress   S1 and S2 normal, no murmurs, clicks, gallops or rubs. Regular rate and rhythm. Chest is clear; no wheezes or rales. No edema or JVD.  heart sounds regular rate and rhythm, S1, S2 normal, no murmur, click, rub or gallop, chest clear, no hepatosplenomegaly, no carotid bruits, feet: normal DP and PT pulses, normal monofilament exam and trophic changes calluses bilateral great toes.  A1c 7.2%     Assessment:      Essential hypertension - well controlled and stable  Hyperlipidemia - asymptomatic  Diabetes--asymptomatic and needs improvement     Plan:     add Actos  1)  Medication: continue current medication regimen unchanged  2)  Recheck in 3 months, sooner should new symptoms or problems arise.         Christopher [name] received counseling on the following healthy behaviors: nutrition, exercise and medication  adherence    Patient given educational materials on Diabetes    I have instructed Christopher [name] to complete a self tracking handout on Blood Sugars  and instructed them to bring it with them to his next appointment.     Discussed use, benefit, and side effects of prescribed medications.  Barriers to medication compliance addressed.  All patient questions answered.  Pt voiced understanding.

## 2017-06-05 NOTE — Patient Instructions (Signed)
Patient Education        Learning About Diabetes Food Guidelines  Your Care Instructions    Meal planning is important to manage diabetes. It helps keep your blood sugar at a target level (which you set with your doctor). You don't have to eat special foods. You can eat what your family eats, including sweets once in a while. But you do have to pay attention to how often you eat and how much you eat of certain foods.  You may want to work with a dietitian or a certified diabetes educator (CDE) to help you plan meals and snacks. A dietitian or CDE can also help you lose weight if that is one of your goals.  What should you know about eating carbs?  Managing the amount of carbohydrate (carbs) you eat is an important part of healthy meals when you have diabetes. Carbohydrate is found in many foods.   Learn which foods have carbs. And learn the amounts of carbs in different foods.   Bread, cereal, pasta, and rice have about 15 grams of carbs in a serving. A serving is 1 slice of bread (1 ounce),  cup of cooked cereal, or 1/3 cup of cooked pasta or rice.   Fruits have 15 grams of carbs in a serving. A serving is 1 small fresh fruit, such as an apple or orange;  of a banana;  cup of cooked or canned fruit;  cup of fruit juice; 1 cup of melon or raspberries; or 2 tablespoons of dried fruit.   Milk and no-sugar-added yogurt have 15 grams of carbs in a serving. A serving is 1 cup of milk or 2/3 cup of no-sugar-added yogurt.   Starchy vegetables have 15 grams of carbs in a serving. A serving is  cup of mashed potatoes or sweet potato; 1 cup winter squash;  of a small baked potato;  cup of cooked beans; or  cup cooked corn or green peas.   Learn how much carbs to eat each day and at each meal. A dietitian or CDE can teach you how to keep track of the amount of carbs you eat. This is called carbohydrate counting.   If you are not sure how to count carbohydrate grams, use the Plate Method to plan meals. It is a  good, quick way to make sure that you have a balanced meal. It also helps you spread carbs throughout the day.   Divide your plate by types of foods. Put non-starchy vegetables on half the plate, meat or other protein food on one-quarter of the plate, and a grain or starchy vegetable in the final quarter of the plate. To this you can add a small piece of fruit and 1 cup of milk or yogurt, depending on how many carbs you are supposed to eat at a meal.   Try to eat about the same amount of carbs at each meal. Do not "save up" your daily allowance of carbs to eat at one meal.   Proteins have very little or no carbs per serving. Examples of proteins are beef, chicken, turkey, fish, eggs, tofu, cheese, cottage cheese, and peanut butter. A serving size of meat is 3 ounces, which is about the size of a deck of cards. Examples of meat substitute serving sizes (equal to 1 ounce of meat) are 1/4 cup of cottage cheese, 1 egg, 1 tablespoon of peanut butter, and  cup of tofu.  How can you eat out and still eat healthy?     Learn to estimate the serving sizes of foods that have carbohydrate. If you measure food at home, it will be easier to estimate the amount in a serving of restaurant food.   If the meal you order has too much carbohydrate (such as potatoes, corn, or baked beans), ask to have a low-carbohydrate food instead. Ask for a salad or green vegetables.   If you use insulin, check your blood sugar before and after eating out to help you plan how much to eat in the future.   If you eat more carbohydrate at a meal than you had planned, take a walk or do other exercise. This will help lower your blood sugar.  What else should you know?   Limit saturated fat, such as the fat from meat and dairy products. This is a healthy choice because people who have diabetes are at higher risk of heart disease. So choose lean cuts of meat and nonfat or low-fat dairy products. Use olive or canola oil instead of butter or shortening  when cooking.   Don't skip meals. Your blood sugar may drop too low if you skip meals and take insulin or certain medicines for diabetes.   Check with your doctor before you drink alcohol. Alcohol can cause your blood sugar to drop too low. Alcohol can also cause a bad reaction if you take certain diabetes medicines.  Follow-up care is a key part of your treatment and safety. Be sure to make and go to all appointments, and call your doctor if you are having problems. It's also a good idea to know your test results and keep a list of the medicines you take.  Where can you learn more?  Go to [email or web address] and sign in to your MyChart account. Enter I147 in the Search Health Information box to learn more about "Learning About Diabetes Food Guidelines."     If you do not have an account, please click on the "Sign Up Now" link.  Current as of: October 16, 2016  Content Version: 11.6   2006-2018 [employer]. Care instructions adapted under license by [facility]. If you have questions about a medical condition or this instruction, always ask your healthcare professional. [employer] disclaims any warranty or liability for your use of this information.

## 2017-06-22 ENCOUNTER — Encounter

## 2017-06-22 MED ORDER — PIOGLITAZONE HCL 30 MG PO TABS
30 | ORAL_TABLET | Freq: Every day | ORAL | 1 refills | Status: DC
Start: 2017-06-22 — End: 2017-07-06

## 2017-06-22 NOTE — Telephone Encounter (Signed)
Wife calling stating that while cleaning house this she accidentally through out his new medication "Actos" and is asking if [facility]?

## 2017-07-06 ENCOUNTER — Encounter

## 2017-07-06 MED ORDER — PIOGLITAZONE HCL 30 MG PO TABS
30 | ORAL_TABLET | Freq: Every day | ORAL | 1 refills | Status: DC
Start: 2017-07-06 — End: 2017-08-31

## 2017-07-06 NOTE — Telephone Encounter (Signed)
prescription resent

## 2017-07-06 NOTE — Telephone Encounter (Signed)
When the patient called on 13 August, he said he wanted the prescription for Actos, which his wife threw out, to go to [facility].  [facility] he want [facility]

## 2017-07-06 NOTE — Telephone Encounter (Signed)
[facility]

## 2017-07-06 NOTE — Telephone Encounter (Signed)
Actos was prescribed as 90 days but insurance will not fill it that way. Only fills 30 days at a time. He checked [facility] and there was not a refill available. Requesting a refill.     Pharm: [facility]

## 2017-07-06 NOTE — Telephone Encounter (Signed)
Pt wife notified via VM

## 2017-08-18 LAB — LIPID PANEL
Cholesterol, Total: 131 mg/dL (ref 100–199)
HDL: 42 mg/dL (ref >39)
LDL Calculated: 60 mg/dL (ref 0–99)
Triglycerides: 147 mg/dL (ref 0–149)
VLDL Cholesterol Calculated: 29 mg/dL (ref 5–40)

## 2017-08-18 LAB — COMPREHENSIVE METABOLIC PANEL
ALT: 25 IU/L (ref 0–44)
AST: 19 IU/L (ref 0–40)
Albumin/Globulin Ratio: 2.1 (ref 1.2–2.2)
Albumin: 4.5 g/dL (ref 3.5–5.5)
Alkaline Phosphatase: 60 IU/L (ref 39–117)
BUN/Creatinine Ratio: 12 (ref 9–20)
BUN: 11 mg/dL (ref 6–24)
CO2: 24 mmol/L (ref 20–29)
Calcium: 9.3 mg/dL (ref 8.7–10.2)
Chloride: 103 mmol/L (ref 96–106)
Creatinine: 0.89 mg/dL (ref 0.76–1.27)
GFR African American: 123 mL/min/{1.73_m2} (ref >59)
GFR Non-African American: 106 mL/min/{1.73_m2} (ref >59)
Globulin: 2.1 g/dL (ref 1.5–4.5)
Glucose: 154 mg/dL — ABNORMAL HIGH (ref 65–99)
Potassium: 4.2 mmol/L (ref 3.5–5.2)
Sodium: 141 mmol/L (ref 134–144)
Total Bilirubin: 1.1 mg/dL (ref 0.0–1.2)
Total Protein: 6.6 g/dL (ref 6.0–8.5)

## 2017-08-18 LAB — AMBIG ABBREV

## 2017-08-31 ENCOUNTER — Ambulatory Visit
Admit: 2017-08-31 | Discharge: 2017-08-31 | Payer: BLUE CROSS/BLUE SHIELD | Attending: Family Medicine | Primary: Family Medicine

## 2017-08-31 DIAGNOSIS — E119 Type 2 diabetes mellitus without complications: Secondary | ICD-10-CM

## 2017-08-31 LAB — POCT GLYCOSYLATED HEMOGLOBIN (HGB A1C): Hemoglobin A1C: 6.8 %

## 2017-08-31 MED ORDER — LISINOPRIL 20 MG PO TABS
20 | ORAL_TABLET | Freq: Every day | ORAL | 1 refills | Status: DC
Start: 2017-08-31 — End: 2018-01-06

## 2017-08-31 MED ORDER — ATORVASTATIN CALCIUM 20 MG PO TABS
20 | ORAL_TABLET | Freq: Every day | ORAL | 1 refills | Status: DC
Start: 2017-08-31 — End: 2018-01-06

## 2017-08-31 MED ORDER — SCOPOLAMINE 1 MG/3DAYS TD PT72
1 MG/3DAYS | MEDICATED_PATCH | TRANSDERMAL | 0 refills | Status: DC
Start: 2017-08-31 — End: 2018-01-06

## 2017-08-31 MED ORDER — PIOGLITAZONE HCL 30 MG PO TABS
30 | ORAL_TABLET | Freq: Every day | ORAL | 1 refills | Status: DC
Start: 2017-08-31 — End: 2018-01-06

## 2017-08-31 MED ORDER — METFORMIN HCL 1000 MG PO TABS
1000 | ORAL_TABLET | Freq: Two times a day (BID) | ORAL | 1 refills | Status: DC
Start: 2017-08-31 — End: 2018-01-06

## 2017-08-31 NOTE — Progress Notes (Signed)
Christopher [name] is a 41 y.o. male who presents for evaluation of hypertension, hyperlipidemia, and diabetes.Marland Kitchen He indicates that he is feeling well and denies any symptoms referable to his elevated blood pressure.   Specifically denies chest pain, palpitations, dyspnea, orthopnea, PND or peripheral edema.  No anorexia, arthralgia, or leg cramps noted. Current medication regimen is as listed below. He denies any side effects of medication, and has been taking it regularly. medication compliance:  compliant all of the time, diabetic diet compliance:  compliant all of the time, home glucose monitoring: are performed regularly, are usually normal, further diabetic ROS: no polyuria or polydipsia, no chest pain, dyspnea or TIAs, no numbness, tingling or pain in extremities, last eye exam approximately 9 months ago.    Will be taking a cruise in January and he wants something to help with possible sea sickness.    ROS: No TIA's or unusual headaches, no dysphagia.  No prolonged cough. No dyspnea or chest pain on exertion.  No abdominal pain, change in bowel habits, black or bloody stools.  No urinary tract or BPH symptoms.  No new or unusual musculoskeletal symptoms.      Current Outpatient Prescriptions   Medication Sig Dispense Refill   . pioglitazone (ACTOS) 30 MG tablet Take 1 tablet by mouth daily 90 tablet 1   . atorvastatin (LIPITOR) 20 MG tablet Take 1 tablet by mouth daily 90 tablet 1   . metFORMIN (GLUCOPHAGE) 1000 MG tablet Take 1 tablet by mouth 2 times daily (with meals) 180 tablet 1   . lisinopril (PRINIVIL;ZESTRIL) 20 MG tablet Take 1 tablet by mouth daily 90 tablet 1   . Blood Glucose Monitoring Suppl (ACCU-CHEK AVIVA PLUS) W/DEVICE KIT   0   . ACCU-CHEK AVIVA PLUS strip   5   . Accu-Chek Softclix Lancets MISC   5     No current facility-administered medications for this visit.        No Known Allergies    Social History   Substance Use Topics   . Smoking status: Former Research scientist (life sciences)   . Smokeless tobacco: Never  Used      Comment: [place] 2011, but smoked since age 34,  2.5 ppd   . Alcohol use Yes          Objective:      BP 122/68 (Site: Left Upper Arm, Position: Sitting, Cuff Size: Medium Adult)   Pulse 68   Temp 96.7 F (35.9 C) (Temporal)   Wt 220 lb 9.6 oz (100.1 kg)   SpO2 97%   BMI 32.58 kg/m   General: Alert and oriented, in no distress   S1 and S2 normal, no murmurs, clicks, gallops or rubs. Regular rate and rhythm. Chest is clear; no wheezes or rales. No edema or JVD.  heart sounds regular rate and rhythm, S1, S2 normal, no murmur, click, rub or gallop, chest clear, no hepatosplenomegaly, no carotid bruits, feet: normal DP and PT pulses, no trophic changes or ulcerative lesions and normal monofilament exam  A1c 6.8     Assessment:      Essential hypertension - well controlled and stable  Hyperlipidemia - asymptomatic  Diabetes--well controlled and stable        Plan:       1)  Medication: continue current medication regimen unchanged  2)  Recheck in 6 months, sooner should new symptoms or problems arise.       Christopher [name] received counseling on the following healthy behaviors: nutrition, exercise and medication  adherence    Patient given educational materials on Diabetes    I have instructed Christopher [name] to complete a self tracking handout on Blood Sugars  and instructed them to bring it with them to his next appointment.     Discussed use, benefit, and side effects of prescribed medications.  Barriers to medication compliance addressed.  All patient questions answered.  Pt voiced understanding.

## 2017-08-31 NOTE — Patient Instructions (Signed)
Patient Education        Diabetes Foot Health: Care Instructions  Your Care Instructions    When you have diabetes, your feet need extra care and attention. Diabetes can damage the nerve endings and blood vessels in your feet, making you less likely to notice when your feet are injured. Diabetes also limits your body's ability to fight infection and get blood to areas that need it. If you get a minor foot injury, it could become an ulcer or a serious infection. With good foot care, you can prevent most of these problems.  Caring for your feet can be quick and easy. Most of the care can be done when you are bathing or getting ready for bed.  Follow-up care is a key part of your treatment and safety. Be sure to make and go to all appointments, and call your doctor if you are having problems. It's also a good idea to know your test results and keep a list of the medicines you take.  How can you care for yourself at home?   Keep your blood sugar close to normal by watching what and how much you eat, monitoring blood sugar, taking medicines if prescribed, and getting regular exercise.   Do not smoke. Smoking affects blood flow and can make foot problems worse. If you need help quitting, talk to your doctor about stop-smoking programs and medicines. These can increase your chances of quitting for good.   Eat a diet that is low in fats. High fat intake can cause fat to build up in your blood vessels and decrease blood flow.   Inspect your feet daily for blisters, cuts, cracks, or sores. If you cannot see well, use a mirror or have someone help you.   Take care of your feet:   Wash your feet every day. Use warm (not hot) water. Check the water temperature with your wrists or other part of your body, not your feet.   Dry your feet well. Pat them dry. Do not rub the skin on your feet too hard. Dry well between your toes. If the skin on your feet stays moist, bacteria or a fungus can grow, which can lead to  infection.   Keep your skin soft. Use moisturizing skin cream to keep the skin on your feet soft and prevent calluses and cracks. But do not put the cream between your toes, and stop using any cream that causes a rash.   Clean underneath your toenails carefully. Do not use a sharp object to clean underneath your toenails. Use the blunt end of a nail file or other rounded tool.   Trim and file your toenails straight across to prevent ingrown toenails. Use a nail clipper, not scissors. Use an emery board to smooth the edges.   Change socks daily. Socks without seams are best, because seams often rub the feet. You can find socks for people with diabetes from specialty catalogs.   Look inside your shoes every day for things like gravel or torn linings, which could cause blisters or sores.   Buy shoes that fit well:   Look for shoes that have plenty of space around the toes. This helps prevent bunions and blisters.   Try on shoes while wearing the kind of socks you will usually wear with the shoes.   Avoid plastic shoes. They may rub your feet and cause blisters. Good shoes should be made of materials that are flexible and breathable, such as leather or cloth.     Break in new shoes slowly by wearing them for no more than an hour a day for several days. Take extra time to check your feet for red areas, blisters, or other problems after you wear new shoes.   Do not go barefoot. Do not wear sandals, and do not wear shoes with very thin soles. Thin soles are easy to puncture. They also do not protect your feet from hot pavement or cold weather.   Have your doctor check your feet during each visit. If you have a foot problem, see your doctor. Do not try to treat an early foot problem at home. Home remedies or treatments that you can buy without a prescription (such as corn removers) can be harmful.   Always get early treatment for foot problems. A minor irritation can lead to a major problem if not properly cared  for early.  When should you call for help?  Call your doctor now or seek immediate medical care if:    You have a foot sore, an ulcer or break in the skin that is not healing after 4 days, bleeding corns or calluses, or an ingrown toenail.     You have blue or black areas, which can mean bruising or blood flow problems.     You have peeling skin or tiny blisters between your toes or cracking or oozing of the skin.     You have a fever for more than 24 hours and a foot sore.     You have new numbness or tingling in your feet that does not go away after you move your feet or change positions.     You have unexplained or unusual swelling of the foot or ankle.   Watch closely for changes in your health, and be sure to contact your doctor if:    You cannot do proper foot care.   Where can you learn more?  Go to [email or web address] and sign in to your MyChart account. Enter A739 in the Search Health Information box to learn more about "Diabetes Foot Health: Care Instructions."     If you do not have an account, please click on the "Sign Up Now" link.  Current as of: October 16, 2016  Content Version: 11.7   2006-2018 [employer]. Care instructions adapted under license by [facility]. If you have questions about a medical condition or this instruction, always ask your healthcare professional. [employer] disclaims any warranty or liability for your use of this information.

## 2017-09-01 LAB — $CPTO - HB

## 2017-09-01 LAB — MICROALBUMIN / CREATININE URINE RATIO
Creatinine: 151.2 MG/DL (ref >20)
Microalb, Ur: 630 ug/dL
Microalbumin Creatinine Ratio: 4 MCG/MG CREAT (ref 0–30)

## 2017-09-02 MED ORDER — SCOPOLAMINE 1 MG/3DAYS TD PT72
1 MG/3DAYS | MEDICATED_PATCH | TRANSDERMAL | 0 refills | Status: DC
Start: 2017-09-02 — End: 2017-09-03

## 2017-09-02 NOTE — Telephone Encounter (Signed)
Pt's wife called stating he was supposed to receive sickness patches. But when they went to the pharmacy, they said they were too expensive and wanted to know if they could get the generic brand instead.

## 2017-09-02 NOTE — Telephone Encounter (Signed)
I thought the prescription I sent was generic.  I will send another prescription.

## 2017-09-02 NOTE — Telephone Encounter (Signed)
Pt wife notified

## 2017-09-02 NOTE — Telephone Encounter (Signed)
Pt called wanting to know if you could send the rx to [facility] on [address] [place]

## 2017-09-03 MED ORDER — SCOPOLAMINE 1 MG/3DAYS TD PT72
1 | MEDICATED_PATCH | TRANSDERMAL | 0 refills | Status: DC
Start: 2017-09-03 — End: 2018-01-06

## 2017-09-03 NOTE — Addendum Note (Signed)
Addended by: Mike GipHOMAS, Shabria Egley J on: 09/03/2017 02:45 PM     Modules accepted: Orders

## 2017-09-03 NOTE — Telephone Encounter (Signed)
Pt wife notified

## 2017-09-03 NOTE — Telephone Encounter (Signed)
Prescription resent

## 2018-01-06 ENCOUNTER — Ambulatory Visit
Admit: 2018-01-06 | Discharge: 2018-01-06 | Payer: BLUE CROSS/BLUE SHIELD | Attending: Family Medicine | Primary: Family Medicine

## 2018-01-06 DIAGNOSIS — E119 Type 2 diabetes mellitus without complications: Secondary | ICD-10-CM

## 2018-01-06 LAB — POCT GLYCOSYLATED HEMOGLOBIN (HGB A1C): Hemoglobin A1C: 7.2 %

## 2018-01-06 MED ORDER — LISINOPRIL 20 MG PO TABS
20 | ORAL_TABLET | Freq: Every day | ORAL | 1 refills | Status: DC
Start: 2018-01-06 — End: 2018-07-07

## 2018-01-06 MED ORDER — ATORVASTATIN CALCIUM 20 MG PO TABS
20 | ORAL_TABLET | Freq: Every day | ORAL | 1 refills | Status: DC
Start: 2018-01-06 — End: 2018-07-07

## 2018-01-06 MED ORDER — PIOGLITAZONE HCL 30 MG PO TABS
30 | ORAL_TABLET | Freq: Every day | ORAL | 1 refills | Status: DC
Start: 2018-01-06 — End: 2018-07-07

## 2018-01-06 MED ORDER — METFORMIN HCL 1000 MG PO TABS
1000 | ORAL_TABLET | Freq: Two times a day (BID) | ORAL | 1 refills | Status: DC
Start: 2018-01-06 — End: 2018-07-07

## 2018-01-06 NOTE — Progress Notes (Signed)
Christopher [name] is a 42 y.o. male who presents for evaluation of hypertension, hyperlipidemia, and diabetes.Marland Kitchen He indicates that he is feeling well and denies any symptoms referable to his elevated blood pressure.   Specifically denies chest pain, palpitations, dyspnea, orthopnea, PND or peripheral edema.  No anorexia, arthralgia, or leg cramps noted. Current medication regimen is as listed below. He denies any side effects of medication, and has been taking it regularly. medication compliance:  compliant all of the time, diabetic diet compliance:  compliant most of the time, home glucose monitoring: are performed regularly, values range 120-130, further diabetic ROS: no polyuria or polydipsia, no chest pain, dyspnea or TIAs, no numbness, tingling or pain in extremities, last eye exam approximately 10 days ago.    Patient has gained 13 pounds in the past 7 months.    ROS: No TIA's or unusual headaches, no dysphagia.  No prolonged cough. No dyspnea or chest pain on exertion.  No abdominal pain, change in bowel habits, black or bloody stools.  No urinary tract or BPH symptoms.  No new or unusual musculoskeletal symptoms.      Current Outpatient Prescriptions   Medication Sig Dispense Refill   ??? lisinopril (PRINIVIL;ZESTRIL) 20 MG tablet Take 1 tablet by mouth daily 90 tablet 1   ??? metFORMIN (GLUCOPHAGE) 1000 MG tablet Take 1 tablet by mouth 2 times daily (with meals) 180 tablet 1   ??? atorvastatin (LIPITOR) 20 MG tablet Take 1 tablet by mouth daily 90 tablet 1   ??? pioglitazone (ACTOS) 30 MG tablet Take 1 tablet by mouth daily 90 tablet 1   ??? Blood Glucose Monitoring Suppl (ACCU-CHEK AVIVA PLUS) W/DEVICE KIT   0   ??? ACCU-CHEK AVIVA PLUS strip   5   ??? Accu-Chek Softclix Lancets MISC   5     No current facility-administered medications for this visit.        No Known Allergies    Social History   Substance Use Topics   ??? Smoking status: Former Smoker   ??? Smokeless tobacco: Never Used      Comment: [place] 2011, but smoked  since age 3,  2.5 ppd   ??? Alcohol use Yes          Objective:      BP 118/70 (Site: Left Upper Arm, Position: Sitting, Cuff Size: Medium Adult)    Pulse 66    Temp 95.9 ??F (35.5 ??C) (Temporal)    Wt 226 lb 11.2 oz (102.8 kg)    SpO2 98%    BMI 33.48 kg/m??   General: Alert and oriented, in no distress   S1 and S2 normal, no murmurs, clicks, gallops or rubs. Regular rate and rhythm. Chest is clear; no wheezes or rales. No edema or JVD.  heart sounds regular rate and rhythm, S1, S2 normal, no murmur, click, rub or gallop, chest clear, no hepatosplenomegaly, no carotid bruits, feet: normal DP and PT pulses, no trophic changes or ulcerative lesions and normal monofilament exam  A1c 7.2%     Assessment:      Essential hypertension - well controlled and stable  Hyperlipidemia - asymptomatic  Diabetes--asymptomatic and needs improvement     Plan:     patient work on diet and exercise.  If A1c remains above 7 in 3 months will increase Actos dose.  1)  Medication: continue current medication regimen unchanged  2)  Recheck in 3 months, sooner should new symptoms or problems arise.  Christopher [name] received counseling on the following healthy behaviors: nutrition, exercise and medication adherence    Patient given educational materials on Diabetes    I have instructed Christopher [name] to complete a self tracking handout on Blood Sugars  and instructed them to bring it with them to his next appointment.     Discussed use, benefit, and side effects of prescribed medications.  Barriers to medication compliance addressed.  All patient questions answered.  Pt voiced understanding.

## 2018-01-06 NOTE — Patient Instructions (Addendum)
Patient Education        Learning About Meal Planning for Diabetes  Why plan your meals?  Meal planning can be a key part of managing diabetes. Planning meals and snacks with the right balance of carbohydrate, protein, and fat can help you keep your blood sugar at the target level you set with your doctor.  You don't have to eat special foods. You can eat what your family eats, including sweets once in a while. But you do have to pay attention to how often you eat and how much you eat of certain foods.  You may want to work with a dietitian or a certified diabetes educator. He or she can give you tips and meal ideas and can answer your questions about meal planning. This health professional can also help you reach a healthy weight if that is one of your goals.  What plan is right for you?  Your dietitian or diabetes educator may suggest that you start with the plate format or carbohydrate counting.  The plate format  The plate format is a simple way to help you manage how you eat. You plan meals by learning how much space each food should take on a plate. Using the plate format helps you spread carbohydrate throughout the day. It can make it easier to keep your blood sugar level within your target range. It also helps you see if you're eating healthy portion sizes.  To use the plate format, you put non-starchy vegetables on half your plate. Add meat or meat substitutes on one-quarter of the plate. Put a grain or starchy vegetable (such as brown rice or a potato) on the final quarter of the plate. You can add a small piece of fruit and some low-fat or fat-free milk or yogurt, depending on your carbohydrate goal for each meal.  Here are some tips for using the plate format:  ?? Make sure that you are not using an oversized plate. A 9-inch plate is best. Many restaurants use larger plates.  ?? Get used to using the plate format at home. Then you can use it when you eat out.  ?? Write down your questions about using the  plate format. Talk to your doctor, a dietitian, or a diabetes educator about your concerns.  Carbohydrate counting  With carbohydrate counting, you plan meals based on the amount of carbohydrate in each food. Carbohydrate raises blood sugar higher and more quickly than any other nutrient. It is found in desserts, breads and cereals, and fruit. It's also found in starchy vegetables such as potatoes and corn, grains such as rice and pasta, and milk and yogurt. Spreading carbohydrate throughout the day helps keep your blood sugar levels within your target range.  Your daily amount depends on several things, including your weight, how active you are, which diabetes medicines you take, and what your goals are for your blood sugar levels. A registered dietitian or diabetes educator can help you plan how much carbohydrate to include in each meal and snack.  A guideline for your daily amount of carbohydrate is:  ?? 45 to 60 grams at each meal. That's about the same as 3 to 4 carbohydrate servings.  ?? 15 to 20 grams at each snack. That's about the same as 1 carbohydrate serving.  The Nutrition Facts label on packaged foods tells you how much carbohydrate is in a serving of the food. First, look at the serving size on the food label. Is that the amount you   eat in a serving? All of the nutrition information on a food label is based on that serving size. So if you eat more or less than that, you'll need to adjust the other numbers. Total carbohydrate is the next thing you need to look for on the label. If you count carbohydrate servings, one serving of carbohydrate is 15 grams.  For foods that don't come with labels, such as fresh fruits and vegetables, you'll need a guide that lists carbohydrate in these foods. Ask your doctor, dietitian, or diabetes educator about books or other nutrition guides you can use.  If you take insulin, you need to know how many grams of carbohydrate are in a meal. This lets you know how much  rapid-acting insulin to take before you eat. If you use an insulin pump, you get a constant rate of insulin during the day. So the pump must be programmed at meals to give you extra insulin to cover the rise in blood sugar after meals.  When you know how much carbohydrate you will eat, you can take the right amount of insulin. Or, if you always use the same amount of insulin, you need to make sure that you eat the same amount of carbohydrate at meals.  If you need more help to understand carbohydrate counting and food labels, ask your doctor, dietitian, or diabetes educator.  How do you get started with meal planning?  Here are some tips to get started:  ?? Plan your meals a week at a time. Don't forget to include snacks too.  ?? Use cookbooks or online recipes to plan several main meals. Plan some quick meals for busy nights. You also can double some recipes that freeze well. Then you can save half for other busy nights when you don't have time to cook.  ?? Make sure you have the ingredients you need for your recipes. If you're running low on basic items, put these items on your shopping list too.  ?? List foods that you use to make breakfasts, lunches, and snacks. List plenty of fruits and vegetables.  ?? Post this list on the refrigerator. Add to it as you think of more things you need.  ?? Take the list to the store to do your weekly shopping.  Follow-up care is a key part of your treatment and safety. Be sure to make and go to all appointments, and call your doctor if you are having problems. It's also a good idea to know your test results and keep a list of the medicines you take.  Where can you learn more?  Go to [email or web address] and sign in to your MyChart account. Enter X936 in the Search Health Information box to learn more about "Learning About Meal Planning for Diabetes."     If you do not have an account, please click on the "Sign Up Now" link.  Current as of: June 03, 2017  Content  Version: 11.9  ?? 2006-2018 [employer]. Care instructions adapted under license by [facility]. If you have questions about a medical condition or this instruction, always ask your healthcare professional. [employer] disclaims any warranty or liability for your use of this information.

## 2018-01-07 LAB — COMPREHENSIVE METABOLIC PANEL
ALT: 34 U/L (ref 0–60)
AST: 22 U/L (ref 0–46)
Albumin/Globulin Ratio: 2.7 (CALC) — ABNORMAL HIGH (ref 0.8–2.6)
Albumin: 5.1 GM/DL (ref 3.5–5.2)
Alkaline Phosphatase: 60 U/L (ref 23–144)
BUN/Creatinine Ratio: 23 (CALC) (ref 7–25)
BUN: 18 MG/DL (ref 3–29)
CO2: 23 MEQ/L (ref 19–32)
Calcium: 9.4 MG/DL (ref 8.5–10.5)
Chloride: 100 MEQ/L (ref 96–110)
Creatinine: 0.8 MG/DL
Est, Glom Filt Rate: 110 mL/min/{1.73_m2}
Globulin: 1.9 GM/DL (CALC) (ref 1.9–3.6)
Glucose: 157 MG/DL — ABNORMAL HIGH
Potassium: 4.3 MEQ/L (ref 3.4–5.3)
Sodium: 138 MEQ/L (ref 135–148)
Total Bilirubin: 1.1 MG/DL (ref 0.0–1.2)
Total Protein: 7 GM/DL (ref 6.0–8.3)

## 2018-01-07 LAB — $CPTO - HB

## 2018-04-06 ENCOUNTER — Ambulatory Visit
Admit: 2018-04-06 | Discharge: 2018-04-06 | Payer: BLUE CROSS/BLUE SHIELD | Attending: Family Medicine | Primary: Family Medicine

## 2018-04-06 DIAGNOSIS — E119 Type 2 diabetes mellitus without complications: Secondary | ICD-10-CM

## 2018-04-06 LAB — POCT GLYCOSYLATED HEMOGLOBIN (HGB A1C): Hemoglobin A1C: 6.8 %

## 2018-04-06 NOTE — Progress Notes (Signed)
Christopher [name] is a 42 y.o. male who presents for evaluation of hypertension, hyperlipidemia, and diabetes.Marland Kitchen He indicates that he is feeling well and denies any symptoms referable to his elevated blood pressure.   Specifically denies chest pain, palpitations, dyspnea, orthopnea, PND or peripheral edema.  No anorexia, arthralgia, or leg cramps noted. Current medication regimen is as listed below. He denies any side effects of medication, and has been taking it regularly. medication compliance:  compliant all of the time, diabetic diet compliance:  compliant most of the time, home glucose monitoring: are performed regularly, are usually normal, further diabetic ROS: no polyuria or polydipsia, no chest pain, dyspnea or TIAs, no numbness, tingling or pain in extremities, last eye exam approximately 3 months ago.    Staying active.  Patient has lost 9 pounds in the past 3 months.      Current Outpatient Medications   Medication Sig Dispense Refill   ??? lisinopril (PRINIVIL;ZESTRIL) 20 MG tablet Take 1 tablet by mouth daily 90 tablet 1   ??? metFORMIN (GLUCOPHAGE) 1000 MG tablet Take 1 tablet by mouth 2 times daily (with meals) 180 tablet 1   ??? atorvastatin (LIPITOR) 20 MG tablet Take 1 tablet by mouth daily 90 tablet 1   ??? pioglitazone (ACTOS) 30 MG tablet Take 1 tablet by mouth daily 90 tablet 1   ??? Blood Glucose Monitoring Suppl (ACCU-CHEK AVIVA PLUS) W/DEVICE KIT   0   ??? ACCU-CHEK AVIVA PLUS strip   5   ??? Accu-Chek Softclix Lancets MISC   5     No current facility-administered medications for this visit.        No Known Allergies    Social History     Tobacco Use   ??? Smoking status: Former Smoker   ??? Smokeless tobacco: Never Used   ??? Tobacco comment: [place] 2011, but smoked since age 40,  2.5 ppd   Substance Use Topics   ??? Alcohol use: Yes          Objective:      BP 110/72 (Site: Left Upper Arm, Position: Sitting, Cuff Size: Medium Adult)    Pulse 77    Temp 95.6 ??F (35.3 ??C) (Temporal)    Wt 217 lb 12.8 oz (98.8 kg)     SpO2 98%    BMI 32.16 kg/m??   General: Alert and oriented, in no distress   S1 and S2 normal, no murmurs, clicks, gallops or rubs. Regular rate and rhythm. Chest is clear; no wheezes or rales. No edema or JVD.  heart sounds regular rate and rhythm, S1, S2 normal, no murmur, click, rub or gallop, chest clear, no hepatosplenomegaly, no carotid bruits, feet: normal DP and PT pulses, no trophic changes or ulcerative lesions and normal monofilament exam   A1c 6.8%  Assessment:      Essential hypertension - well controlled and stable  Hyperlipidemia - asymptomatic  Diabetes--well controlled and asymptomatic     Plan:     Patient declines pneumonia and DTap vaccines  1)  Medication: continue current medication regimen unchanged  2)  Recheck in 3 months, sooner should new symptoms or problems arise.     Christopher [name] received counseling on the following healthy behaviors: nutrition, exercise and medication adherence    Patient given educational materials on Diabetes    I have instructed Christopher [name] to complete a self tracking handout on Blood Sugars  and instructed them to bring it with them to his next appointment.     Discussed  use, benefit, and side effects of prescribed medications.  Barriers to medication compliance addressed.  All patient questions answered.  Pt voiced understanding.

## 2018-04-06 NOTE — Patient Instructions (Signed)
Patient Education        Diabetes Foot Health: Care Instructions  Your Care Instructions    When you have diabetes, your feet need extra care and attention. Diabetes can damage the nerve endings and blood vessels in your feet, making you less likely to notice when your feet are injured. Diabetes also limits your body's ability to fight infection and get blood to areas that need it. If you get a minor foot injury, it could become an ulcer or a serious infection. With good foot care, you can prevent most of these problems.  Caring for your feet can be quick and easy. Most of the care can be done when you are bathing or getting ready for bed.  Follow-up care is a key part of your treatment and safety. Be sure to make and go to all appointments, and call your doctor if you are having problems. It's also a good idea to know your test results and keep a list of the medicines you take.  How can you care for yourself at home?  ?? Keep your blood sugar close to normal by watching what and how much you eat, monitoring blood sugar, taking medicines if prescribed, and getting regular exercise.  ?? Do not smoke. Smoking affects blood flow and can make foot problems worse. If you need help quitting, talk to your doctor about stop-smoking programs and medicines. These can increase your chances of quitting for good.  ?? Eat a diet that is low in fats. High fat intake can cause fat to build up in your blood vessels and decrease blood flow.  ?? Inspect your feet daily for blisters, cuts, cracks, or sores. If you cannot see well, use a mirror or have someone help you.  ?? Take care of your feet:  ? Wash your feet every day. Use warm (not hot) water. Check the water temperature with your wrists or other part of your body, not your feet.  ? Dry your feet well. Pat them dry. Do not rub the skin on your feet too hard. Dry well between your toes. If the skin on your feet stays moist, bacteria or a fungus can grow, which can lead to  infection.  ? Keep your skin soft. Use moisturizing skin cream to keep the skin on your feet soft and prevent calluses and cracks. But do not put the cream between your toes, and stop using any cream that causes a rash.  ? Clean underneath your toenails carefully. Do not use a sharp object to clean underneath your toenails. Use the blunt end of a nail file or other rounded tool.  ? Trim and file your toenails straight across to prevent ingrown toenails. Use a nail clipper, not scissors. Use an emery board to smooth the edges.  ?? Change socks daily. Socks without seams are best, because seams often rub the feet. You can find socks for people with diabetes from specialty catalogs.  ?? Look inside your shoes every day for things like gravel or torn linings, which could cause blisters or sores.  ?? Buy shoes that fit well:  ? Look for shoes that have plenty of space around the toes. This helps prevent bunions and blisters.  ? Try on shoes while wearing the kind of socks you will usually wear with the shoes.  ? Avoid plastic shoes. They may rub your feet and cause blisters. Good shoes should be made of materials that are flexible and breathable, such as leather or cloth.  ?   Break in new shoes slowly by wearing them for no more than an hour a day for several days. Take extra time to check your feet for red areas, blisters, or other problems after you wear new shoes.  ?? Do not go barefoot. Do not wear sandals, and do not wear shoes with very thin soles. Thin soles are easy to puncture. They also do not protect your feet from hot pavement or cold weather.  ?? Have your doctor check your feet during each visit. If you have a foot problem, see your doctor. Do not try to treat an early foot problem at home. Home remedies or treatments that you can buy without a prescription (such as corn removers) can be harmful.  ?? Always get early treatment for foot problems. A minor irritation can lead to a major problem if not properly cared  for early.  When should you call for help?  Call your doctor now or seek immediate medical care if:  ?? ?? You have a foot sore, an ulcer or break in the skin that is not healing after 4 days, bleeding corns or calluses, or an ingrown toenail.   ?? ?? You have blue or black areas, which can mean bruising or blood flow problems.   ?? ?? You have peeling skin or tiny blisters between your toes or cracking or oozing of the skin.   ?? ?? You have a fever for more than 24 hours and a foot sore.   ?? ?? You have new numbness or tingling in your feet that does not go away after you move your feet or change positions.   ?? ?? You have unexplained or unusual swelling of the foot or ankle.   ??Watch closely for changes in your health, and be sure to contact your doctor if:  ?? ?? You cannot do proper foot care.   Where can you learn more?  Go to [email or web address] and sign in to your MyChart account. Enter A739 in the Search Health Information box to learn more about "Diabetes Foot Health: Care Instructions."     If you do not have an account, please click on the "Sign Up Now" link.  Current as of: June 03, 2017  Content Version: 12.0  ?? 2006-2019 [employer]. Care instructions adapted under license by [facility]. If you have questions about a medical condition or this instruction, always ask your healthcare professional. [employer] disclaims any warranty or liability for your use of this information.

## 2018-05-07 NOTE — Telephone Encounter (Signed)
Pharmacy verified they have a refill and are getting it ready for pt. Left message advising

## 2018-05-07 NOTE — Telephone Encounter (Signed)
Patient calling asking if you were going to call in his metformin?

## 2018-05-07 NOTE — Telephone Encounter (Signed)
I sent a prescription for 90 days with a refill in February.  He still should have a refill at the pharmacy.

## 2018-07-05 ENCOUNTER — Encounter: Attending: Family Medicine | Primary: Family Medicine

## 2018-07-07 ENCOUNTER — Ambulatory Visit
Admit: 2018-07-07 | Discharge: 2018-07-07 | Payer: BLUE CROSS/BLUE SHIELD | Attending: Family Medicine | Primary: Family Medicine

## 2018-07-07 DIAGNOSIS — E119 Type 2 diabetes mellitus without complications: Secondary | ICD-10-CM

## 2018-07-07 LAB — POCT GLYCOSYLATED HEMOGLOBIN (HGB A1C): Hemoglobin A1C: 7 %

## 2018-07-07 MED ORDER — PIOGLITAZONE HCL 45 MG PO TABS
45 MG | ORAL_TABLET | Freq: Every day | ORAL | 1 refills | Status: DC
Start: 2018-07-07 — End: 2019-01-05

## 2018-07-07 MED ORDER — LISINOPRIL 20 MG PO TABS
20 MG | ORAL_TABLET | Freq: Every day | ORAL | 1 refills | Status: DC
Start: 2018-07-07 — End: 2019-01-05

## 2018-07-07 MED ORDER — METFORMIN HCL 1000 MG PO TABS
1000 MG | ORAL_TABLET | Freq: Two times a day (BID) | ORAL | 1 refills | Status: DC
Start: 2018-07-07 — End: 2018-12-08

## 2018-07-07 MED ORDER — ATORVASTATIN CALCIUM 20 MG PO TABS
20 MG | ORAL_TABLET | Freq: Every day | ORAL | 1 refills | Status: DC
Start: 2018-07-07 — End: 2019-01-05

## 2018-07-07 NOTE — Patient Instructions (Signed)
Patient Education        Ankle: Exercises  Introduction  Here are some examples of exercises for you to try. The exercises may be suggested for a condition or for rehabilitation. Start each exercise slowly. Ease off the exercises if you start to have pain.  You will be told when to start these exercises and which ones will work best for you.  Alphabet exercise  "Alphabet" exercise    1. Trace the alphabet with your toe. This helps your ankle move in all directions.    Side-to-side knee swing exercise    1. Sit in a chair with your foot flat on the floor.  2. Slowly move your knee from side to side while keeping your foot pressed flat.  3. Continue this exercise for 2 to 3 minutes.    Towel curl    1. While sitting, place your foot on a towel on the floor and scrunch the towel toward you with your toes.  2. Then use your toes to push the towel away from you.  3. Make this exercise more challenging by placing a weighted object, such as a soup can, on the other end of the towel.    Towel stretch    1. Sit with your legs extended and knees straight.  2. Place a towel around your foot just under the toes.  3. Hold each end of the towel in each hand, with your hands above your knees.  4. Pull back with the towel so that your foot stretches toward you.  5. Hold the position for at least 15 to 30 seconds.  6. Repeat 2 to 4 times a session, up to 5 sessions a day.    Ankle eversion exercise    1. Start by sitting with your foot flat on the floor and pushing it outward against an immovable object such as the wall or heavy furniture. Hold for about 6 seconds, then relax. Repeat 8 to 12 times.  2. After you feel comfortable with this, try using rubber tubing looped around the outside of your feet for resistance. Push your foot out to the side against the tubing, and then count to 10 as you slowly bring your foot back to the middle. Repeat 8 to 12 times.    Isometric opposition exercises    1. While sitting, put your feet  together flat on the floor.  2. Press your injured foot inward against your other foot. Hold for about 6 seconds, and relax. Repeat 8 to 12 times.  3. Then place the heel of your other foot on top of the injured one. Push down with the top heel while trying to push up with your injured foot. Hold for about 6 seconds, and relax. Repeat 8 to 12 times.    Follow-up care is a key part of your treatment and safety. Be sure to make and go to all appointments, and call your doctor if you are having problems. It's also a good idea to know your test results and keep a list of the medicines you take.  Where can you learn more?  Go to [email or web address] and sign in to your MyChart account. Enter R730 in the Search Health Information box to learn more about "Ankle: Exercises."     If you do not have an account, please click on the "Sign Up Now" link.  Current as of: July 30, 2017  Content Version: 12.1  ?? 2006-2019 [employer]. Care instructions adapted under license by   [facility]. If you have questions about a medical condition or this instruction, always ask your healthcare professional. [employer] disclaims any warranty or liability for your use of this information.

## 2018-07-07 NOTE — Progress Notes (Signed)
Christopher [name] is a 42 y.o. male who presents for evaluation of hypertension, hyperlipidemia, and diabetes.Marland Kitchen He indicates that he is feeling well and denies any symptoms referable to his elevated blood pressure.   Specifically denies chest pain, palpitations, dyspnea, orthopnea, PND or peripheral edema.  No anorexia, arthralgia, or leg cramps noted. Current medication regimen is as listed below. He denies any side effects of medication, and has been taking it regularly. medication compliance:  compliant all of the time, diabetic diet compliance:  compliant most of the time, home glucose monitoring: are performed regularly, are usually normal, further diabetic ROS: no polyuria or polydipsia, no chest pain, dyspnea or TIAs, no numbness, tingling or pain in extremities, last eye exam approximately 6 months ago.    Ankle Pain: Patient complains of right ankle pain.  Onset of the symptoms was 3 days ago. Inciting event: none known. Current symptoms include ability to bear weight, but with some pain and stiffness.  Aggravating symptoms: any weight bearing, and weather changes. Patient's overall course: symptoms have progressed to a point and plateaued. Patient has had prior ankle problems. Previous visits for this problem: none.  Evaluation to date: none.  Treatment to date: avoidance of offending activity.      ROS: No TIA's or unusual headaches, no dysphagia.  No prolonged cough. No dyspnea or chest pain on exertion.  No abdominal pain, change in bowel habits, black or bloody stools.  No urinary tract or BPH symptoms.  No new or unusual musculoskeletal symptoms.      Current Outpatient Medications   Medication Sig Dispense Refill   . lisinopril (PRINIVIL;ZESTRIL) 20 MG tablet Take 1 tablet by mouth daily 90 tablet 1   . metFORMIN (GLUCOPHAGE) 1000 MG tablet Take 1 tablet by mouth 2 times daily (with meals) 180 tablet 1   . atorvastatin (LIPITOR) 20 MG tablet Take 1 tablet by mouth daily 90 tablet 1   . pioglitazone  (ACTOS) 30 MG tablet Take 1 tablet by mouth daily 90 tablet 1   . Blood Glucose Monitoring Suppl (ACCU-CHEK AVIVA PLUS) W/DEVICE KIT   0   . ACCU-CHEK AVIVA PLUS strip   5   . Accu-Chek Softclix Lancets MISC   5     No current facility-administered medications for this visit.        No Known Allergies    Social History     Tobacco Use   . Smoking status: Former Games developer   . Smokeless tobacco: Never Used   . Tobacco comment: [place] 2011, but smoked since age 50,  2.5 ppd   Substance Use Topics   . Alcohol use: Yes          Objective:      BP 112/76 (Site: Left Upper Arm, Position: Sitting, Cuff Size: Large Adult)   Pulse 60   Temp 96.1 F (35.6 C) (Temporal)   Wt 218 lb 11.2 oz (99.2 kg)   SpO2 97%   BMI 32.30 kg/m   General: Alert and oriented, in no distress, obese  S1 and S2 normal, no murmurs, clicks, gallops or rubs. Regular rate and rhythm. Chest is clear; no wheezes or rales. No edema or JVD.  heart sounds regular rate and rhythm, S1, S2 normal, no murmur, click, rub or gallop, chest clear, no hepatosplenomegaly, no carotid bruits, feet: normal DP and PT pulses, no trophic changes or ulcerative lesions and normal monofilament exam  Right ankle without swelling, erythema, or ecchymosis.  No tenderness.  Full range  of motion.  Normal gait.  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     Christopher [name] received counseling on the following healthy behaviors: nutrition, exercise and medication adherence    Patient given educational materials on Diabetes    I have instructed Christopher [name] to complete a self tracking handout on Blood Sugars  and instructed them to  bring it with them to his next appointment.     Discussed use, benefit, and side effects of prescribed medications.  Barriers to medication compliance addressed.  All patient questions answered.  Pt voiced understanding.

## 2018-07-08 LAB — COMPREHENSIVE METABOLIC PANEL
ALT: 40 U/L (ref 0–60)
AST: 28 U/L (ref 0–46)
Albumin/Globulin Ratio: 2.4 (CALC) (ref 0.8–2.6)
Albumin: 5 GM/DL (ref 3.5–5.2)
Alkaline Phosphatase: 55 U/L (ref 23–144)
BUN/Creatinine Ratio: 14 (CALC) (ref 7–25)
BUN: 14 MG/DL (ref 3–29)
CO2: 25 MEQ/L (ref 19–32)
Calcium: 9.9 MG/DL (ref 8.5–10.5)
Chloride: 101 MEQ/L (ref 96–110)
Creatinine: 1 MG/DL
Est, Glom Filt Rate: 92 mL/min/{1.73_m2}
Globulin: 2.1 GM/DL (CALC) (ref 1.9–3.6)
Glucose: 116 MG/DL — ABNORMAL HIGH
Potassium: 4.1 MEQ/L (ref 3.4–5.3)
Sodium: 141 MEQ/L (ref 135–148)
Total Bilirubin: 1.2 MG/DL (ref 0.0–1.2)
Total Protein: 7.1 GM/DL (ref 6.0–8.3)

## 2018-07-08 LAB — MICROALBUMIN / CREATININE URINE RATIO
Creatinine: 183 MG/DL (ref >20)
Microalb, Ur: 550 ug/dL
Microalb/Creat Ratio: 3 ug/mg{creat} (ref 0–30)

## 2018-07-08 LAB — LIPID PANEL
Cholesterol, Total: 125 MG/DL
HDL: 34 MG/DL — ABNORMAL LOW
LDL Cholesterol: 47 MG/DL (CALC)
Triglycerides: 222 MG/DL — ABNORMAL HIGH
VLDL: 44 MG/DL (CALC) — ABNORMAL HIGH (ref 4–32)

## 2018-10-05 ENCOUNTER — Ambulatory Visit
Admit: 2018-10-05 | Discharge: 2018-10-05 | Payer: BLUE CROSS/BLUE SHIELD | Attending: Family Medicine | Primary: Family Medicine

## 2018-10-05 DIAGNOSIS — E119 Type 2 diabetes mellitus without complications: Secondary | ICD-10-CM

## 2018-10-05 LAB — POCT GLYCOSYLATED HEMOGLOBIN (HGB A1C): Hemoglobin A1C: 6.8 %

## 2018-10-05 NOTE — Patient Instructions (Signed)
Patient Education        Diabetes Foot Health: Care Instructions  Your Care Instructions    When you have diabetes, your feet need extra care and attention. Diabetes can damage the nerve endings and blood vessels in your feet, making you less likely to notice when your feet are injured. Diabetes also limits your body's ability to fight infection and get blood to areas that need it. If you get a minor foot injury, it could become an ulcer or a serious infection. With good foot care, you can prevent most of these problems.  Caring for your feet can be quick and easy. Most of the care can be done when you are bathing or getting ready for bed.  Follow-up care is a key part of your treatment and safety. Be sure to make and go to all appointments, and call your doctor if you are having problems. It's also a good idea to know your test results and keep a list of the medicines you take.  How can you care for yourself at home?  ?? Keep your blood sugar close to normal by watching what and how much you eat, monitoring blood sugar, taking medicines if prescribed, and getting regular exercise.  ?? Do not smoke. Smoking affects blood flow and can make foot problems worse. If you need help quitting, talk to your doctor about stop-smoking programs and medicines. These can increase your chances of quitting for good.  ?? Eat a diet that is low in fats. High fat intake can cause fat to build up in your blood vessels and decrease blood flow.  ?? Inspect your feet daily for blisters, cuts, cracks, or sores. If you cannot see well, use a mirror or have someone help you.  ?? Take care of your feet:  ? Wash your feet every day. Use warm (not hot) water. Check the water temperature with your wrists or other part of your body, not your feet.  ? Dry your feet well. Pat them dry. Do not rub the skin on your feet too hard. Dry well between your toes. If the skin on your feet stays moist, bacteria or a fungus can grow, which can lead to  infection.  ? Keep your skin soft. Use moisturizing skin cream to keep the skin on your feet soft and prevent calluses and cracks. But do not put the cream between your toes, and stop using any cream that causes a rash.  ? Clean underneath your toenails carefully. Do not use a sharp object to clean underneath your toenails. Use the blunt end of a nail file or other rounded tool.  ? Trim and file your toenails straight across to prevent ingrown toenails. Use a nail clipper, not scissors. Use an emery board to smooth the edges.  ?? Change socks daily. Socks without seams are best, because seams often rub the feet. You can find socks for people with diabetes from specialty catalogs.  ?? Look inside your shoes every day for things like gravel or torn linings, which could cause blisters or sores.  ?? Buy shoes that fit well:  ? Look for shoes that have plenty of space around the toes. This helps prevent bunions and blisters.  ? Try on shoes while wearing the kind of socks you will usually wear with the shoes.  ? Avoid plastic shoes. They may rub your feet and cause blisters. Good shoes should be made of materials that are flexible and breathable, such as leather or cloth.  ?   Break in new shoes slowly by wearing them for no more than an hour a day for several days. Take extra time to check your feet for red areas, blisters, or other problems after you wear new shoes.  ?? Do not go barefoot. Do not wear sandals, and do not wear shoes with very thin soles. Thin soles are easy to puncture. They also do not protect your feet from hot pavement or cold weather.  ?? Have your doctor check your feet during each visit. If you have a foot problem, see your doctor. Do not try to treat an early foot problem at home. Home remedies or treatments that you can buy without a prescription (such as corn removers) can be harmful.  ?? Always get early treatment for foot problems. A minor irritation can lead to a major problem if not properly cared  for early.  When should you call for help?  Call your doctor now or seek immediate medical care if:  ?? ?? You have a foot sore, an ulcer or break in the skin that is not healing after 4 days, bleeding corns or calluses, or an ingrown toenail.   ?? ?? You have blue or black areas, which can mean bruising or blood flow problems.   ?? ?? You have peeling skin or tiny blisters between your toes or cracking or oozing of the skin.   ?? ?? You have a fever for more than 24 hours and a foot sore.   ?? ?? You have new numbness or tingling in your feet that does not go away after you move your feet or change positions.   ?? ?? You have unexplained or unusual swelling of the foot or ankle.   ??Watch closely for changes in your health, and be sure to contact your doctor if:  ?? ?? You cannot do proper foot care.   Where can you learn more?  Go to [email or web address] and sign in to your MyChart account. Enter A739 in the Search Health Information box to learn more about "Diabetes Foot Health: Care Instructions."     If you do not have an account, please click on the "Sign Up Now" link.  Current as of: June 03, 2017  Content Version: 12.1  ?? 2006-2019 [employer]. Care instructions adapted under license by [facility]. If you have questions about a medical condition or this instruction, always ask your healthcare professional. [employer] disclaims any warranty or liability for your use of this information.

## 2018-10-05 NOTE — Progress Notes (Signed)
Christopher [name] is a 42 y.o. male who presents for evaluation of hypertension, hyperlipidemia, and diabetes.Marland Kitchen He indicates that he is feeling well and denies any symptoms referable to his elevated blood pressure.   Specifically denies chest pain, palpitations, dyspnea, orthopnea, PND or peripheral edema.  No anorexia, arthralgia, or leg cramps noted. Current medication regimen is as listed below. He denies any side effects of medication, and has been taking it regularly. medication compliance:  compliant all of the time, diabetic diet compliance:  compliant most of the time, home glucose monitoring: are performed regularly, are usually normal, further diabetic ROS: no polyuria or polydipsia, no chest pain, dyspnea or TIAs, no numbness, tingling or pain in extremities, last eye exam approximately 9 months ago.    ROS: No TIA's or unusual headaches, no dysphagia.  No prolonged cough. No dyspnea or chest pain on exertion.  No abdominal pain, change in bowel habits, black or bloody stools.  No urinary tract or BPH symptoms.  No new or unusual musculoskeletal symptoms.      Current Outpatient Medications   Medication Sig Dispense Refill   ??? lisinopril (PRINIVIL;ZESTRIL) 20 MG tablet Take 1 tablet by mouth daily 90 tablet 1   ??? metFORMIN (GLUCOPHAGE) 1000 MG tablet Take 1 tablet by mouth 2 times daily (with meals) 180 tablet 1   ??? atorvastatin (LIPITOR) 20 MG tablet Take 1 tablet by mouth daily 90 tablet 1   ??? pioglitazone (ACTOS) 45 MG tablet Take 1 tablet by mouth daily 90 tablet 1   ??? Blood Glucose Monitoring Suppl (ACCU-CHEK AVIVA PLUS) W/DEVICE KIT   0   ??? ACCU-CHEK AVIVA PLUS strip   5   ??? Accu-Chek Softclix Lancets MISC   5     No current facility-administered medications for this visit.        No Known Allergies    Social History     Tobacco Use   ??? Smoking status: Former Smoker   ??? Smokeless tobacco: Never Used   ??? Tobacco comment: [place] 2011, but smoked since age 58,  2.5 ppd   Substance Use Topics   ??? Alcohol  use: Yes          Objective:      BP 126/82 (Site: Left Upper Arm, Position: Sitting, Cuff Size: Large Adult)    Pulse 68    Temp 96 ??F (35.6 ??C) (Temporal)    Wt 223 lb 6.4 oz (101.3 kg)    SpO2 98%    BMI 32.99 kg/m??   General: Alert and oriented, in no distress   S1 and S2 normal, no murmurs, clicks, gallops or rubs. Regular rate and rhythm. Chest is clear; no wheezes or rales. No edema or JVD.  heart sounds regular rate and rhythm, S1, S2 normal, no murmur, click, rub or gallop, chest clear, no hepatosplenomegaly, no carotid bruits, feet: normal DP and PT pulses, normal monofilament exam and trophic changes calluses bilateral great toes.   A1c 6.8%  Assessment:      Essential hypertension - well controlled and stable  Hyperlipidemia - asymptomatic  Diabetes--well controlled and stable     Plan:       1)  Medication: continue current medication regimen unchanged  2)  Recheck in 3 months, sooner should new symptoms or problems arise.       Ukiah received counseling on the following healthy behaviors: nutrition, exercise and medication adherence    Patient given educational materials on Diabetes    I have  instructed [name] to complete a self tracking handout on Blood Pressures  and instructed them to bring it with them to his next appointment.     Discussed use, benefit, and side effects of prescribed medications.  Barriers to medication compliance addressed.  All patient questions answered.  Pt voiced understanding.

## 2018-12-08 ENCOUNTER — Encounter

## 2018-12-08 MED ORDER — METFORMIN HCL 1000 MG PO TABS
1000 MG | ORAL_TABLET | Freq: Two times a day (BID) | ORAL | 0 refills | Status: DC
Start: 2018-12-08 — End: 2019-01-05

## 2018-12-08 NOTE — Telephone Encounter (Signed)
Patient wife called in stating that her husband is about to run out of his metformin and would like another script so he can make it to his next appt which is on 01/07/2019. Please Advise

## 2018-12-08 NOTE — Telephone Encounter (Signed)
Left message to call office

## 2018-12-08 NOTE — Telephone Encounter (Signed)
Pt is taking medication BID but will be out in a week

## 2018-12-08 NOTE — Telephone Encounter (Signed)
I want to confirm that he is only taking his metformin twice daily.  He was given a 64-month supply on August 28 which should last him until his appointment.  I can send a bridge prescription if needed.

## 2018-12-08 NOTE — Telephone Encounter (Signed)
Patient was advise and aware

## 2018-12-08 NOTE — Telephone Encounter (Signed)
Pharmacy [facility] on [address]

## 2019-01-05 ENCOUNTER — Ambulatory Visit
Admit: 2019-01-05 | Discharge: 2019-01-05 | Payer: BLUE CROSS/BLUE SHIELD | Attending: Family Medicine | Primary: Family Medicine

## 2019-01-05 DIAGNOSIS — E119 Type 2 diabetes mellitus without complications: Secondary | ICD-10-CM

## 2019-01-05 LAB — POCT GLYCOSYLATED HEMOGLOBIN (HGB A1C): Hemoglobin A1C: 6.6 %

## 2019-01-05 MED ORDER — PIOGLITAZONE HCL 45 MG PO TABS
45 MG | ORAL_TABLET | Freq: Every day | ORAL | 1 refills | Status: DC
Start: 2019-01-05 — End: 2019-06-29

## 2019-01-05 MED ORDER — METFORMIN HCL 1000 MG PO TABS
1000 MG | ORAL_TABLET | Freq: Two times a day (BID) | ORAL | 1 refills | Status: DC
Start: 2019-01-05 — End: 2019-06-29

## 2019-01-05 MED ORDER — ATORVASTATIN CALCIUM 20 MG PO TABS
20 MG | ORAL_TABLET | Freq: Every day | ORAL | 1 refills | Status: DC
Start: 2019-01-05 — End: 2019-06-29

## 2019-01-05 MED ORDER — LISINOPRIL 20 MG PO TABS
20 MG | ORAL_TABLET | Freq: Every day | ORAL | 1 refills | Status: DC
Start: 2019-01-05 — End: 2019-06-29

## 2019-01-05 NOTE — Progress Notes (Signed)
Christopher [name] is a 43 y.o. male who presents for evaluation of hypertension, hyperlipidemia, and diabetes.Marland Kitchen He indicates that he is feeling well and denies any symptoms referable to his elevated blood pressure.   Specifically denies chest pain, palpitations, dyspnea, orthopnea, PND or peripheral edema.  No anorexia, arthralgia, or leg cramps noted. Current medication regimen is as listed below. He denies any side effects of medication, and has been taking it regularly. medication compliance:  compliant all of the time, diabetic diet compliance:  compliant most of the time, home glucose monitoring: are performed regularly, are usually normal, further diabetic ROS: no polyuria or polydipsia, no chest pain, dyspnea or TIAs, no numbness, tingling or pain in extremities, last eye exam approximately 1 year ago.    ROS: No TIA's or unusual headaches, no dysphagia.  No prolonged cough. No dyspnea or chest pain on exertion.  No abdominal pain, change in bowel habits, black or bloody stools.  No urinary tract or BPH symptoms.  No new or unusual musculoskeletal symptoms.      Current Outpatient Medications   Medication Sig Dispense Refill   ??? metFORMIN (GLUCOPHAGE) 1000 MG tablet Take 1 tablet by mouth 2 times daily (with meals) 60 tablet 0   ??? lisinopril (PRINIVIL;ZESTRIL) 20 MG tablet Take 1 tablet by mouth daily 90 tablet 1   ??? atorvastatin (LIPITOR) 20 MG tablet Take 1 tablet by mouth daily 90 tablet 1   ??? pioglitazone (ACTOS) 45 MG tablet Take 1 tablet by mouth daily 90 tablet 1   ??? Blood Glucose Monitoring Suppl (ACCU-CHEK AVIVA PLUS) W/DEVICE KIT   0   ??? ACCU-CHEK AVIVA PLUS strip   5   ??? Accu-Chek Softclix Lancets MISC   5     No current facility-administered medications for this visit.        No Known Allergies    Social History     Tobacco Use   ??? Smoking status: Former Smoker   ??? Smokeless tobacco: Never Used   ??? Tobacco comment: [place] 2011, but smoked since age 12,  2.5 ppd   Substance Use Topics   ??? Alcohol  use: Yes          Objective:      BP 110/72 (Site: Left Upper Arm, Position: Sitting, Cuff Size: Large Adult)    Pulse 68    Temp 97.9 ??F (36.6 ??C) (Temporal)    Wt 221 lb (100.2 kg)    SpO2 98%    BMI 32.64 kg/m??   General: Alert and oriented, in no distress   S1 and S2 normal, no murmurs, clicks, gallops or rubs. Regular rate and rhythm. Chest is clear; no wheezes or rales. No edema or JVD.  heart sounds regular rate and rhythm, S1, S2 normal, no murmur, click, rub or gallop, chest clear, no hepatosplenomegaly, no carotid bruits, feet: normal DP and PT pulses, no trophic changes or ulcerative lesions and normal monofilament exam  A1c 6.6%     Assessment:      Essential hypertension - well controlled and stable  Hyperlipidemia - asymptomatic  Diabetes--well controlled and stable     Plan:       1)  Medication: continue current medication regimen unchanged  2)  Recheck in 6 months, sooner should new symptoms or problems arise.     Zubayr received counseling on the following healthy behaviors: nutrition, exercise and medication adherence    Patient given educational materials on Diabetes    I have instructed Christopher [name]  to complete a self tracking handout on Blood Sugars  and instructed them to bring it with them to his next appointment.     Discussed use, benefit, and side effects of prescribed medications.  Barriers to medication compliance addressed.  All patient questions answered.  Pt voiced understanding.

## 2019-01-05 NOTE — Patient Instructions (Signed)
Patient Education        Diabetes Foot Health: Care Instructions  Your Care Instructions    When you have diabetes, your feet need extra care and attention. Diabetes can damage the nerve endings and blood vessels in your feet, making you less likely to notice when your feet are injured. Diabetes also limits your body's ability to fight infection and get blood to areas that need it. If you get a minor foot injury, it could become an ulcer or a serious infection. With good foot care, you can prevent most of these problems.  Caring for your feet can be quick and easy. Most of the care can be done when you are bathing or getting ready for bed.  Follow-up care is a key part of your treatment and safety. Be sure to make and go to all appointments, and call your doctor if you are having problems. It's also a good idea to know your test results and keep a list of the medicines you take.  How can you care for yourself at home?  ?? Keep your blood sugar close to normal by watching what and how much you eat, monitoring blood sugar, taking medicines if prescribed, and getting regular exercise.  ?? Do not smoke. Smoking affects blood flow and can make foot problems worse. If you need help quitting, talk to your doctor about stop-smoking programs and medicines. These can increase your chances of quitting for good.  ?? Eat a diet that is low in fats. High fat intake can cause fat to build up in your blood vessels and decrease blood flow.  ?? Inspect your feet daily for blisters, cuts, cracks, or sores. If you cannot see well, use a mirror or have someone help you.  ?? Take care of your feet:  ? Wash your feet every day. Use warm (not hot) water. Check the water temperature with your wrists or other part of your body, not your feet.  ? Dry your feet well. Pat them dry. Do not rub the skin on your feet too hard. Dry well between your toes. If the skin on your feet stays moist, bacteria or a fungus can grow, which can lead to  infection.  ? Keep your skin soft. Use moisturizing skin cream to keep the skin on your feet soft and prevent calluses and cracks. But do not put the cream between your toes, and stop using any cream that causes a rash.  ? Clean underneath your toenails carefully. Do not use a sharp object to clean underneath your toenails. Use the blunt end of a nail file or other rounded tool.  ? Trim and file your toenails straight across to prevent ingrown toenails. Use a nail clipper, not scissors. Use an emery board to smooth the edges.  ?? Change socks daily. Socks without seams are best, because seams often rub the feet. You can find socks for people with diabetes from specialty catalogs.  ?? Look inside your shoes every day for things like gravel or torn linings, which could cause blisters or sores.  ?? Buy shoes that fit well:  ? Look for shoes that have plenty of space around the toes. This helps prevent bunions and blisters.  ? Try on shoes while wearing the kind of socks you will usually wear with the shoes.  ? Avoid plastic shoes. They may rub your feet and cause blisters. Good shoes should be made of materials that are flexible and breathable, such as leather or cloth.  ?   Break in new shoes slowly by wearing them for no more than an hour a day for several days. Take extra time to check your feet for red areas, blisters, or other problems after you wear new shoes.  ?? Do not go barefoot. Do not wear sandals, and do not wear shoes with very thin soles. Thin soles are easy to puncture. They also do not protect your feet from hot pavement or cold weather.  ?? Have your doctor check your feet during each visit. If you have a foot problem, see your doctor. Do not try to treat an early foot problem at home. Home remedies or treatments that you can buy without a prescription (such as corn removers) can be harmful.  ?? Always get early treatment for foot problems. A minor irritation can lead to a major problem if not properly cared  for early.  When should you call for help?  Call your doctor now or seek immediate medical care if:  ?? ?? You have a foot sore, an ulcer or break in the skin that is not healing after 4 days, bleeding corns or calluses, or an ingrown toenail.   ?? ?? You have blue or black areas, which can mean bruising or blood flow problems.   ?? ?? You have peeling skin or tiny blisters between your toes or cracking or oozing of the skin.   ?? ?? You have a fever for more than 24 hours and a foot sore.   ?? ?? You have new numbness or tingling in your feet that does not go away after you move your feet or change positions.   ?? ?? You have unexplained or unusual swelling of the foot or ankle.   ??Watch closely for changes in your health, and be sure to contact your doctor if:  ?? ?? You cannot do proper foot care.   Where can you learn more?  Go to [email or web address] and sign in to your MyChart account. Enter A739 in the Search Health Information box to learn more about "Diabetes Foot Health: Care Instructions."     If you do not have an account, please click on the "Sign Up Now" link.  Current as of: February 23, 2018  Content Version: 12.3  ?? 2006-2019 [employer]. Care instructions adapted under license by [facility]. If you have questions about a medical condition or this instruction, always ask your healthcare professional. [employer] disclaims any warranty or liability for your use of this information.

## 2019-01-06 LAB — COMPREHENSIVE METABOLIC PANEL
ALT: 30 U/L (ref 0–60)
AST: 26 U/L (ref 0–46)
Albumin/Globulin Ratio: 2.1 (CALC) (ref 0.8–2.6)
Albumin: 4.7 GM/DL (ref 3.5–5.2)
Alkaline Phosphatase: 66 U/L (ref 23–144)
BUN/Creatinine Ratio: 10 (CALC) (ref 7–25)
BUN: 9 MG/DL (ref 3–29)
CO2: 25 MEQ/L (ref 19–32)
Calcium: 9.6 MG/DL (ref 8.5–10.5)
Chloride: 101 MEQ/L (ref 96–110)
Creatinine: 0.9 MG/DL
Est, Glom Filt Rate: 104 mL/min/{1.73_m2}
Globulin: 2.2 GM/DL (CALC) (ref 1.9–3.6)
Glucose: 124 — ABNORMAL HIGH
Potassium: 4.1 MEQ/L (ref 3.4–5.3)
Sodium: 138 MEQ/L (ref 135–148)
Total Bilirubin: 1 MG/DL (ref 0.0–1.2)
Total Protein: 6.9 GM/DL (ref 6.0–8.3)

## 2019-06-11 ENCOUNTER — Encounter

## 2019-06-16 ENCOUNTER — Encounter

## 2019-06-16 NOTE — Telephone Encounter (Signed)
Patient needs appointment

## 2019-06-16 NOTE — Telephone Encounter (Signed)
Patient has enough medication to last until appointment on 06/29/2019.

## 2019-06-16 NOTE — Telephone Encounter (Signed)
Left message to call office.

## 2019-06-29 ENCOUNTER — Telehealth
Admit: 2019-06-29 | Discharge: 2019-06-29 | Payer: BLUE CROSS/BLUE SHIELD | Attending: Family Medicine | Primary: Family Medicine

## 2019-06-29 DIAGNOSIS — E119 Type 2 diabetes mellitus without complications: Secondary | ICD-10-CM

## 2019-06-29 MED ORDER — ATORVASTATIN CALCIUM 20 MG PO TABS
20 MG | ORAL_TABLET | Freq: Every day | ORAL | 1 refills | Status: DC
Start: 2019-06-29 — End: 2019-12-20

## 2019-06-29 MED ORDER — LISINOPRIL 20 MG PO TABS
20 MG | ORAL_TABLET | Freq: Every day | ORAL | 1 refills | Status: DC
Start: 2019-06-29 — End: 2019-12-20

## 2019-06-29 MED ORDER — PIOGLITAZONE HCL 45 MG PO TABS
45 MG | ORAL_TABLET | Freq: Every day | ORAL | 1 refills | Status: DC
Start: 2019-06-29 — End: 2019-12-20

## 2019-06-29 MED ORDER — METFORMIN HCL 1000 MG PO TABS
1000 MG | ORAL_TABLET | Freq: Two times a day (BID) | ORAL | 1 refills | Status: DC
Start: 2019-06-29 — End: 2019-12-20

## 2019-06-29 NOTE — Progress Notes (Signed)
06/29/2019    TELEHEALTH EVALUATION -- Audio/Visual (During ACZYS-06 public health emergency)    HPI:    Christopher [name] (DOB:  10/31/1976) has requested an audio/video evaluation for the following concern(s):    Follow-up hypertension, hyperlipidemia, and diabetes.Marland Kitchen He indicates that he is feeling well and denies any symptoms referable to his elevated blood pressure.   Specifically denies chest pain, palpitations, dyspnea, orthopnea, PND or peripheral edema.  No anorexia, arthralgia, or leg cramps noted. Current medication regimen is as listed below. He denies any side effects of medication, and has been taking it regularly. medication compliance:  compliant all of the time, diabetic diet compliance:  compliant most of the time, home glucose monitoring: are performed regularly, are usually 120-140, further diabetic ROS: no polyuria or polydipsia, no chest pain, dyspnea or TIAs, no numbness, tingling or pain in extremities, last eye exam approximately 18 months ago.    Review of Systems   Constitutional: Negative.    HENT: Negative.    Eyes: Negative.    Respiratory: Negative.    Cardiovascular: Negative.    Gastrointestinal: Negative.    Endocrine: Negative.    Genitourinary: Negative.    Musculoskeletal: Negative.    Skin: Negative.    Allergic/Immunologic: Negative.    Neurological: Negative.    Hematological: Negative.    Psychiatric/Behavioral: Negative.        Prior to Visit Medications    Medication Sig Taking? Authorizing Provider   metFORMIN (GLUCOPHAGE) 1000 MG tablet Take 1 tablet by mouth 2 times daily (with meals)  Aleatha Borer, MD   lisinopril (PRINIVIL;ZESTRIL) 20 MG tablet Take 1 tablet by mouth daily  Aleatha Borer, MD   atorvastatin (LIPITOR) 20 MG tablet Take 1 tablet by mouth daily  Aleatha Borer, MD   pioglitazone (ACTOS) 45 MG tablet Take 1 tablet by mouth daily  Aleatha Borer, MD   Blood Glucose Monitoring Suppl (ACCU-CHEK AVIVA PLUS) W/DEVICE KIT   Historical Provider, MD   ACCU-CHEK  AVIVA PLUS strip   Historical Provider, MD   Accu-Chek Softclix Lancets MISC   Historical Provider, MD       Social History     Tobacco Use   ??? Smoking status: Former Smoker   ??? Smokeless tobacco: Never Used   ??? Tobacco comment: [place] 2011, but smoked since age 78,  2.5 ppd   Substance Use Topics   ??? Alcohol use: Yes   ??? Drug use: No        No Known Allergies,   Past Medical History:   Diagnosis Date   ??? Allergic rhinitis    ??? Diabetes mellitus ([place])    ??? Hyperlipidemia    , No past surgical history on file.,   Social History     Tobacco Use   ??? Smoking status: Former Smoker   ??? Smokeless tobacco: Never Used   ??? Tobacco comment: [place] 2011, but smoked since age 89,  2.5 ppd   Substance Use Topics   ??? Alcohol use: Yes   ??? Drug use: No       PHYSICAL EXAMINATION:  [ INSTRUCTIONS:  "_0 " Indicates a positive item  "_1 " Indicates a negative item  -- DELETE ALL ITEMS NOT EXAMINED]  Vital Signs: (As obtained by patient/caregiver or practitioner observation)    Not available     Constitutional: _2  Appears well-developed and well-nourished _3  No apparent distress      _4  Abnormal-   Mental status  _5  Alert and awake  [  x] Oriented to person/place/time _0 Able to follow commands      Eyes:  EOM    _1   Normal  _2  Abnormal-  Sclera  _3   Normal  _4  Abnormal -         Discharge _5   None visible  _6  Abnormal -    HENT:   <EFEOFHQRFXJOITGP>_4<\/DIYMEBRAXENMMHWK>_0  Normocephalic, atraumatic.  _8  Abnormal   _9  Mouth/Throat: Mucous membranes are moist.     External Ears _10  Normal  _11  Abnormal-     Neck: _12  No visualized mass     Pulmonary/Chest: _13  Respiratory effort normal.  _14  No visualized signs of difficulty breathing or respiratory distress        _15  Abnormal-      Musculoskeletal:   _16  Normal range of motion of neck        _17  Abnormal-       Neurological:        _18  No Facial Asymmetry (Cranial nerve 7 motor function) (limited exam to video visit)          _19  No gaze palsy        _20  Abnormal-         Skin:        _21  No significant exanthematous lesions or  discoloration noted on facial skin         _22  Abnormal-            Psychiatric:       _23  Normal Affect        _24  Abnormal-     Other pertinent observable physical exam findings-     ASSESSMENT/PLAN:  1. Type 2 diabetes mellitus without complication, without long-term current use of insulin (HCC)  Asymptomatic  - Hemoglobin A1C; Future  - Comprehensive Metabolic Panel; Future  - metFORMIN (GLUCOPHAGE) 1000 MG tablet; Take 1 tablet by mouth 2 times daily (with meals)  Dispense: 180 tablet; Refill: 1  - pioglitazone (ACTOS) 45 MG tablet; Take 1 tablet by mouth daily  Dispense: 90 tablet; Refill: 1  - Microalbumin / Creatinine Urine Ratio; Future    2. HTN, goal below 140/90  Asymptomatic  - Comprehensive Metabolic Panel; Future  - lisinopril (PRINIVIL;ZESTRIL) 20 MG tablet; Take 1 tablet by mouth daily  Dispense: 90 tablet; Refill: 1    3. Hyperlipidemia associated with type 2 diabetes mellitus ([place])  Asymptomatic  - Comprehensive Metabolic Panel; Future  - Lipid Panel; Future  - atorvastatin (LIPITOR) 20 MG tablet; Take 1 tablet by mouth daily  Dispense: 90 tablet; Refill: 1      Return in about 6 months (around 12/30/2019) for diabetes, HTN, hyperlipidemia.    Christopher [name] is a 43 y.o. male being evaluated by a Virtual Visit (video visit) encounter to address concerns as mentioned above.  A caregiver was present when appropriate. Due to this being a Scientist, physiological (During SUPJS-31 public health emergency), evaluation of the following organ systems was limited: Vitals/Constitutional/EENT/Resp/CV/GI/GU/MS/Neuro/Skin/Heme-Lymph-Imm.  Pursuant to the emergency declaration under the [place] waiver authority and the [employer] Act, this Virtual Visit was conducted with patient's (and/or legal guardian's) consent, to reduce the patient's risk of exposure to COVID-19 and provide necessary medical care.  The patient  (and/or legal guardian) has also been advised to contact this office for worsening conditions or problems, and seek emergency medical treatment and/or call 911 if deemed necessary.     Patient identification was verified at the start of  the visit: Yes    Total time spent on this encounter: Not billed by time    Services were provided through a video synchronous discussion virtually to substitute for in-person clinic visit. Patient and provider were located at their individual homes.    --Aleatha Borer, MD on 06/29/2019 at 7:21 AM    An electronic signature was used to authenticate this note.

## 2019-07-06 ENCOUNTER — Encounter: Attending: Family Medicine | Primary: Family Medicine

## 2019-07-29 ENCOUNTER — Inpatient Hospital Stay: Payer: BLUE CROSS/BLUE SHIELD | Primary: Family Medicine

## 2019-07-29 DIAGNOSIS — E1169 Type 2 diabetes mellitus with other specified complication: Secondary | ICD-10-CM

## 2019-07-29 LAB — ALBUMIN, RANDOM URINE
Creatinine, Ur: 20.4 MG/DL — ABNORMAL LOW (ref 39–259)
Microalb, Ur: <1.2 mg/dl (ref <2.0)
Microalbumin Creatinine Ratio: UNDETERMINED MG/G CREAT (ref 0–30)

## 2019-08-06 LAB — COMPREHENSIVE METABOLIC PANEL
ALT: 32 IU/L (ref 0–44)
AST: 24 IU/L (ref 0–40)
Albumin/Globulin Ratio: 2.5 — ABNORMAL HIGH (ref 1.2–2.2)
Albumin: 4.8 g/dL (ref 4.0–5.0)
Alkaline Phosphatase: 63 IU/L (ref 39–117)
BUN/Creatinine Ratio: 13 (ref 9–20)
BUN: 12 mg/dL (ref 6–24)
CO2: 24 mmol/L (ref 20–29)
Calcium: 10.1 mg/dL (ref 8.7–10.2)
Chloride: 103 mmol/L (ref 96–106)
Creatinine: 0.91 mg/dL (ref 0.76–1.27)
GFR African American: 119 mL/min/{1.73_m2} (ref >59)
GFR Non-African American: 103 mL/min/{1.73_m2} (ref >59)
Globulin: 1.9 g/dL (ref 1.5–4.5)
Glucose: 153 mg/dL — ABNORMAL HIGH (ref 65–99)
Potassium: 4.9 mmol/L (ref 3.5–5.2)
Sodium: 141 mmol/L (ref 134–144)
Total Bilirubin: 0.9 mg/dL (ref 0.0–1.2)
Total Protein: 6.7 g/dL (ref 6.0–8.5)

## 2019-08-06 LAB — HEMOGLOBIN A1C: Hemoglobin A1C: 6.9 % — ABNORMAL HIGH (ref 4.8–5.6)

## 2019-08-06 LAB — LIPID PANEL
Cholesterol, Total: 117 mg/dL (ref 100–199)
HDL: 39 mg/dL — ABNORMAL LOW (ref >39)
LDL Calculated: 56 mg/dL (ref 0–99)
Triglycerides: 122 mg/dL (ref 0–149)
VLDL Cholesterol Calculated: 22 mg/dL (ref 5–40)

## 2019-08-22 NOTE — Telephone Encounter (Signed)
Patient's wife informed.

## 2019-08-22 NOTE — Telephone Encounter (Signed)
Patient's wife called wanting to know if patient's metformin was one of the ones being recalled.

## 2019-08-22 NOTE — Telephone Encounter (Signed)
The only way to know that would be to call the pharmacy.  They have the lot numbers that were dispensed and they would know if it is 1 of the lot numbers that have been recalled.

## 2019-12-16 ENCOUNTER — Encounter

## 2019-12-20 ENCOUNTER — Ambulatory Visit
Admit: 2019-12-20 | Discharge: 2019-12-20 | Payer: BLUE CROSS/BLUE SHIELD | Attending: Family Medicine | Primary: Family Medicine

## 2019-12-20 DIAGNOSIS — E119 Type 2 diabetes mellitus without complications: Secondary | ICD-10-CM

## 2019-12-20 LAB — POCT GLYCOSYLATED HEMOGLOBIN (HGB A1C): Hemoglobin A1C: 6.7 %

## 2019-12-20 MED ORDER — ATORVASTATIN CALCIUM 20 MG PO TABS
20 MG | ORAL_TABLET | Freq: Every day | ORAL | 1 refills | Status: DC
Start: 2019-12-20 — End: 2020-06-26

## 2019-12-20 MED ORDER — METFORMIN HCL 1000 MG PO TABS
1000 MG | ORAL_TABLET | Freq: Two times a day (BID) | ORAL | 1 refills | Status: DC
Start: 2019-12-20 — End: 2020-06-26

## 2019-12-20 MED ORDER — PIOGLITAZONE HCL 45 MG PO TABS
45 MG | ORAL_TABLET | Freq: Every day | ORAL | 1 refills | Status: DC
Start: 2019-12-20 — End: 2020-06-26

## 2019-12-20 MED ORDER — LISINOPRIL 20 MG PO TABS
20 MG | ORAL_TABLET | Freq: Every day | ORAL | 1 refills | Status: DC
Start: 2019-12-20 — End: 2020-06-26

## 2019-12-21 LAB — COMPREHENSIVE METABOLIC PANEL
ALT: 35 U/L (ref 0–60)
AST: 29 U/L (ref 0–55)
Albumin/Globulin Ratio: 2.6 RATIO (ref 0.8–2.6)
Albumin: 5.1 G/DL (ref 3.5–5.2)
Alkaline Phosphatase: 62 U/L (ref 23–144)
BUN: 9 MG/DL (ref 3–29)
Bun/Cre Ratio: 11 (ref 7–25)
CO2: 21 MEQ/L (ref 19–32)
Calcium: 9.7 MG/DL (ref 8.5–10.5)
Chloride: 101 MEQ/L (ref 96–110)
Creatinine: 0.8 MG/DL (ref 0.5–1.4)
GFR African American: 126 mL/min/{1.73_m2} (ref >60)
GFR Non-African American: 109 mL/min/{1.73_m2} (ref >60)
Globulin: 2 G/DL (ref 1.9–3.6)
Glucose: 150 MG/DL — ABNORMAL HIGH (ref 70–99)
Potassium: 4.5 MEQ/L (ref 3.4–5.3)
Sodium: 137 MEQ/L (ref 135–148)
Total Bilirubin: 0.7 MG/DL (ref 0.0–1.2)
Total Protein: 7.1 G/DL (ref 6.0–8.3)

## 2020-01-18 ENCOUNTER — Ambulatory Visit: Payer: BLUE CROSS/BLUE SHIELD | Primary: Family Medicine

## 2020-06-13 ENCOUNTER — Encounter

## 2020-06-13 NOTE — Telephone Encounter (Signed)
Scheduled

## 2020-06-13 NOTE — Telephone Encounter (Signed)
Patient needs an appointment

## 2020-06-26 ENCOUNTER — Ambulatory Visit
Admit: 2020-06-26 | Discharge: 2020-06-26 | Payer: BLUE CROSS/BLUE SHIELD | Attending: Family Medicine | Primary: Family Medicine

## 2020-06-26 DIAGNOSIS — E119 Type 2 diabetes mellitus without complications: Secondary | ICD-10-CM

## 2020-06-26 LAB — POCT GLYCOSYLATED HEMOGLOBIN (HGB A1C): Hemoglobin A1C: 7 %

## 2020-06-26 MED ORDER — ATORVASTATIN CALCIUM 20 MG PO TABS
20 MG | ORAL_TABLET | Freq: Every day | ORAL | 1 refills | Status: DC
Start: 2020-06-26 — End: 2020-12-26

## 2020-06-26 MED ORDER — METFORMIN HCL 1000 MG PO TABS
1000 MG | ORAL_TABLET | Freq: Two times a day (BID) | ORAL | 1 refills | Status: DC
Start: 2020-06-26 — End: 2020-12-26

## 2020-06-26 MED ORDER — PIOGLITAZONE HCL 45 MG PO TABS
45 MG | ORAL_TABLET | Freq: Every day | ORAL | 1 refills | Status: DC
Start: 2020-06-26 — End: 2020-12-26

## 2020-06-26 MED ORDER — LISINOPRIL 20 MG PO TABS
20 MG | ORAL_TABLET | Freq: Every day | ORAL | 1 refills | Status: DC
Start: 2020-06-26 — End: 2020-12-26

## 2020-06-27 LAB — COMPREHENSIVE METABOLIC PANEL
ALT: 31 U/L (ref 0–60)
AST: 27 U/L (ref 0–55)
Albumin/Globulin Ratio: 2.9 RATIO — ABNORMAL HIGH (ref 0.8–2.6)
Albumin: 4.6 G/DL (ref 3.5–5.2)
Alkaline Phosphatase: 61 U/L (ref 23–144)
BUN: 13 MG/DL (ref 3–29)
Bun/Cre Ratio: 13 (ref 7–25)
CO2: 23 MEQ/L (ref 19–32)
Calcium: 10 MG/DL (ref 8.5–10.5)
Chloride: 103 MEQ/L (ref 96–110)
Creatinine: 1 MG/DL (ref 0.5–1.4)
GFR African American: 105 mL/min/{1.73_m2} (ref >60)
GFR Non-African American: 91 mL/min/{1.73_m2} (ref >60)
Globulin: 1.6 G/DL — ABNORMAL LOW (ref 1.9–3.6)
Glucose: 174 MG/DL — ABNORMAL HIGH (ref 70–99)
Potassium: 4.6 MEQ/L (ref 3.4–5.3)
Sodium: 140 MEQ/L (ref 135–148)
Total Bilirubin: 0.6 MG/DL (ref 0.0–1.2)
Total Protein: 6.2 G/DL (ref 6.0–8.3)

## 2020-06-27 LAB — MICROALBUMIN / CREATININE URINE RATIO
Creatinine, Ur: 159.2 MG/DL
Microalbumin Creatinine Ratio: 5 MCG/MG CREAT. (ref <30)
Microalbumin, Random Urine: 720 ug/dL

## 2020-06-27 LAB — LIPID PANEL
Cholesterol: 129 MG/DL (ref <200)
HDL: 35 MG/DL — ABNORMAL LOW (ref >59)
LDL Calculated: 52 MG/DL (ref <100)
Triglycerides: 211 MG/DL — ABNORMAL HIGH (ref <150)
VLDL: 42 MG/DL — ABNORMAL HIGH (ref 4–32)

## 2020-09-26 ENCOUNTER — Ambulatory Visit
Admit: 2020-09-26 | Discharge: 2020-09-26 | Payer: BLUE CROSS/BLUE SHIELD | Attending: Family Medicine | Primary: Family Medicine

## 2020-09-26 DIAGNOSIS — E119 Type 2 diabetes mellitus without complications: Secondary | ICD-10-CM

## 2020-09-26 LAB — POCT GLYCOSYLATED HEMOGLOBIN (HGB A1C): Hemoglobin A1C: 6.9 %

## 2020-09-26 NOTE — Patient Instructions (Signed)
Patient Education        Learning About Meal Planning for Diabetes  Why plan your meals?     Meal planning can be a key part of managing diabetes. Planning meals and snacks with the right balance of carbohydrate, protein, and fat can help you keep your blood sugar at the target level you set with your doctor.  You don't have to eat special foods. You can eat what your family eats, including sweets once in a while. But you do have to pay attention to how often you eat and how much you eat of certain foods.  You may want to work with a dietitian or a certified diabetes educator. He or she can give you tips and meal ideas and can answer your questions about meal planning. This health professional can also help you reach a healthy weight if that is one of your goals.  What plan is right for you?  Your dietitian or diabetes educator may suggest that you start with the plate format or carbohydrate counting.  The plate format  The plate format is a simple way to help you manage how you eat. You plan meals by learning how much space each food should take on a plate. Using the plate format helps you spread carbohydrate throughout the day. It can make it easier to keep your blood sugar level within your target range. It also helps you see if you're eating healthy portion sizes.  To use the plate format, you put non-starchy vegetables on half your plate. Add meat or meat substitutes on one-quarter of the plate. Put a grain or starchy vegetable (such as brown rice or a potato) on the final quarter of the plate. You can add a small piece of fruit and some low-fat or fat-free milk or yogurt, depending on your carbohydrate goal for each meal.  Here are some tips for using the plate format:  ?? Make sure that you are not using an oversized plate. A 9-inch plate is best. Many restaurants use larger plates.  ?? Get used to using the plate format at home. Then you can use it when you eat out.  ?? Write down your questions about using  the plate format. Talk to your doctor, a dietitian, or a diabetes educator about your concerns.  Carbohydrate counting  With carbohydrate counting, you plan meals based on the amount of carbohydrate in each food. Carbohydrate raises blood sugar higher and more quickly than any other nutrient. It is found in desserts, breads and cereals, and fruit. It's also found in starchy vegetables such as potatoes and corn, grains such as rice and pasta, and milk and yogurt. Spreading carbohydrate throughout the day helps keep your blood sugar levels within your target range.  Your daily amount depends on several things, including your weight, how active you are, which diabetes medicines you take, and what your goals are for your blood sugar levels. A registered dietitian or diabetes educator can help you plan how much carbohydrate to include in each meal and snack.  A guideline for your daily amount of carbohydrate is:  ?? 45 to 60 grams at each meal. That's about the same as 3 to 4 carbohydrate servings.  ?? 15 to 20 grams at each snack. That's about the same as 1 carbohydrate serving.  The Nutrition Facts label on packaged foods tells you how much carbohydrate is in a serving of the food. First, look at the serving size on the food label. Is that   the amount you eat in a serving? All of the nutrition information on a food label is based on that serving size. So if you eat more or less than that, you'll need to adjust the other numbers. Total carbohydrate is the next thing you need to look for on the label. If you count carbohydrate servings, one serving of carbohydrate is 15 grams.  For foods that don't come with labels, such as fresh fruits and vegetables, you'll need a guide that lists carbohydrate in these foods. Ask your doctor, dietitian, or diabetes educator about books or other nutrition guides you can use.  If you take insulin, you need to know how many grams of carbohydrate are in a meal. This lets you know how much  rapid-acting insulin to take before you eat. If you use an insulin pump, you get a constant rate of insulin during the day. So the pump must be programmed at meals to give you extra insulin to cover the rise in blood sugar after meals.  When you know how much carbohydrate you will eat, you can take the right amount of insulin. Or, if you always use the same amount of insulin, you need to make sure that you eat the same amount of carbohydrate at meals.  If you need more help to understand carbohydrate counting and food labels, ask your doctor, dietitian, or diabetes educator.  How can you plan healthy meals?  Here are some tips to get started:  ?? Plan your meals a week at a time. Don't forget to include snacks too.  ?? Use cookbooks or online recipes to plan several main meals. Plan some quick meals for busy nights. You also can double some recipes that freeze well. Then you can save half for other busy nights when you don't have time to cook.  ?? Make sure you have the ingredients you need for your recipes. If you're running low on basic items, put these items on your shopping list too.  ?? List foods that you use to make breakfasts, lunches, and snacks. List plenty of fruits and vegetables.  ?? Post this list on the refrigerator. Add to it as you think of more things you need.  ?? Take the list to the store to do your weekly shopping.  Follow-up care is a key part of your treatment and safety. Be sure to make and go to all appointments, and call your doctor if you are having problems. It's also a good idea to know your test results and keep a list of the medicines you take.  Where can you learn more?  Go to [email or web address] and sign in to your MyChart account. Enter X936 in the Search Health Information box to learn more about "Learning About Meal Planning for Diabetes."     If you do not have an account, please click on the "Sign Up Now" link.  Current as of: October 27, 2019??????????????????????????????Content Version: 13.0  ?? 2006-2021 [employer]wise, Inc[employer].   Care instructions adapted under license by [facility]. If you have questions about a medical condition or this instruction, always ask your healthcare professional. [employer], [employer] disclaims any warranty or liability for your use of this information.

## 2020-09-26 NOTE — Progress Notes (Signed)
Christopher [name] is a 44 y.o. male who presents for evaluation of hypertension, hyperlipidemia, and diabetes.Marland Kitchen He indicates that he is feeling well and denies any symptoms referable to his elevated blood pressure.   Specifically denies chest pain, palpitations, dyspnea, orthopnea, PND or peripheral edema.  No anorexia, arthralgia, or leg cramps noted. Current medication regimen is as listed below. He denies any side effects of medication, and has been taking it regularly. medication compliance:  compliant all of the time, diabetic diet compliance:  noncompliant: much of the time, home glucose monitoring: are performed regularly, values range 150s, further diabetic ROS: no polyuria or polydipsia, no chest pain, dyspnea or TIAs, no numbness, tingling or pain in extremities, last eye exam approximately 6 months ago.    Recent left temporal headaches. The CT in October, ordered by optometry,  showed sinusitis.  Headaches have been improving.    Current Outpatient Medications   Medication Sig Dispense Refill   ??? latanoprost (XALATAN) 0.005 % ophthalmic solution INSTILL 1 DROP INTO EACH EYE EVERY DAY AT BEDTIME     ??? metFORMIN (GLUCOPHAGE) 1000 MG tablet Take 1 tablet by mouth 2 times daily (with meals) 180 tablet 1   ??? lisinopril (PRINIVIL;ZESTRIL) 20 MG tablet Take 1 tablet by mouth daily 90 tablet 1   ??? atorvastatin (LIPITOR) 20 MG tablet Take 1 tablet by mouth daily 90 tablet 1   ??? pioglitazone (ACTOS) 45 MG tablet Take 1 tablet by mouth daily 90 tablet 1   ??? Blood Glucose Monitoring Suppl (ACCU-CHEK AVIVA PLUS) W/DEVICE KIT   0   ??? ACCU-CHEK AVIVA PLUS strip   5   ??? Accu-Chek Softclix Lancets MISC   5     No current facility-administered medications for this visit.       No Known Allergies    Social History     Tobacco Use   ??? Smoking status: Former Smoker   ??? Smokeless tobacco: Never Used   ??? Tobacco comment: [place] 2011, but smoked since age 8,  2.5 ppd   Substance Use Topics   ??? Alcohol use: Yes           Objective:      BP 132/84 (Site: Left Upper Arm, Position: Sitting, Cuff Size: Medium Adult)    Pulse 68    Temp 96.4 ??F (35.8 ??C) (Infrared)    Wt 218 lb 6.4 oz (99.1 kg)    SpO2 96%    BMI 31.34 kg/m??   General: Alert and oriented, in no distress , obese  S1 and S2 normal, no murmurs, clicks, gallops or rubs. Regular rate and rhythm. Chest is clear; no wheezes or rales. No edema or JVD.  heart sounds regular rate and rhythm, S1, S2 normal, no murmur, click, rub or gallop, chest clear, no hepatosplenomegaly, no carotid bruits, feet: normal DP and PT pulses, no trophic changes or ulcerative lesions and normal monofilament exam  Neuro:  CN II-XII grossly intact  A1c 6.9%      Assessment:           Diagnosis Orders   1. Type 2 diabetes mellitus without complication, without long-term current use of insulin (HCC)   Well controlled POCT glycosylated hemoglobin (Hb A1C)   2. HTN, goal below 140/90   controlled    3. Hyperlipidemia associated with type 2 diabetes mellitus ([place])   asymptomatic    4. Left temporal headache     resolved    Plan:     continue to  work on diet and exercise.  1)  Medication: continue current medication regimen unchanged  2)  Recheck in 3 months, sooner should new symptoms or problems arise.       Loyalty received counseling on the following healthy behaviors: nutrition, exercise and medication adherence    Patient given educational materials on Diabetes    I have instructed Christopher [name] to complete a self tracking handout on Blood Sugars  and instructed them to bring it with them to his next appointment.     Discussed use, benefit, and side effects of prescribed medications.  Barriers to medication compliance addressed.  All patient questions answered.  Pt voiced understanding.

## 2020-10-02 MED ORDER — AMOXICILLIN 500 MG PO CAPS
500 MG | ORAL_CAPSULE | Freq: Three times a day (TID) | ORAL | 0 refills | Status: AC
Start: 2020-10-02 — End: 2020-10-12

## 2020-10-02 NOTE — Telephone Encounter (Signed)
Patient is wanting to know the results of the CT.  Provider that ordered test told him to ask PCP.

## 2020-10-02 NOTE — Telephone Encounter (Signed)
Patient's wife called stating the eye doctor had ordered a CT scan and was supposed to send you the results for you to go over with the patient. Patient would like those results. She stated patient can be reached at [phone].

## 2020-10-02 NOTE — Telephone Encounter (Signed)
Based on the CT results ordered by the other provider, it shows he may have a possible sinusitis so I think a course of antibiotics is reasonable if he wants to take it.

## 2020-10-02 NOTE — Telephone Encounter (Signed)
Please send copy of CT to patient.

## 2020-10-02 NOTE — Telephone Encounter (Signed)
Patient notified would like Rx sent to [facility]

## 2020-10-02 NOTE — Telephone Encounter (Signed)
CT showed some thickening of the lining of the right maxillary sinus but otherwise normal.

## 2020-10-02 NOTE — Telephone Encounter (Signed)
Patient aware

## 2020-10-02 NOTE — Telephone Encounter (Signed)
Prescription sent to [facility].

## 2020-10-02 NOTE — Telephone Encounter (Signed)
Patient informed. He stated he is still having the headache on the left side of his head.

## 2020-12-10 ENCOUNTER — Encounter

## 2020-12-26 ENCOUNTER — Ambulatory Visit
Admit: 2020-12-26 | Discharge: 2020-12-26 | Payer: BLUE CROSS/BLUE SHIELD | Attending: Family Medicine | Primary: Family Medicine

## 2020-12-26 DIAGNOSIS — E119 Type 2 diabetes mellitus without complications: Secondary | ICD-10-CM

## 2020-12-26 LAB — POCT GLYCOSYLATED HEMOGLOBIN (HGB A1C): Hemoglobin A1C: 7.4 %

## 2020-12-26 MED ORDER — LISINOPRIL 20 MG PO TABS
20 MG | ORAL_TABLET | Freq: Every day | ORAL | 1 refills | Status: DC
Start: 2020-12-26 — End: 2021-06-12

## 2020-12-26 MED ORDER — PIOGLITAZONE HCL 45 MG PO TABS
45 MG | ORAL_TABLET | Freq: Every day | ORAL | 1 refills | Status: DC
Start: 2020-12-26 — End: 2021-06-12

## 2020-12-26 MED ORDER — ATORVASTATIN CALCIUM 20 MG PO TABS
20 MG | ORAL_TABLET | Freq: Every day | ORAL | 1 refills | Status: DC
Start: 2020-12-26 — End: 2021-06-12

## 2020-12-26 MED ORDER — METFORMIN HCL 1000 MG PO TABS
1000 MG | ORAL_TABLET | Freq: Two times a day (BID) | ORAL | 1 refills | Status: DC
Start: 2020-12-26 — End: 2021-06-12

## 2020-12-26 MED ORDER — SITAGLIPTIN PHOSPHATE 100 MG PO TABS
100 MG | ORAL_TABLET | Freq: Every day | ORAL | 1 refills | Status: DC
Start: 2020-12-26 — End: 2021-06-12

## 2020-12-26 NOTE — Progress Notes (Signed)
Christopher [name] is a 45 y.o. male who presents for evaluation of hypertension, hyperlipidemia, and diabetes.Marland Kitchen He indicates that he is feeling well and denies any symptoms referable to his elevated blood pressure.   Specifically denies chest pain, palpitations, dyspnea, orthopnea, PND or peripheral edema.  No anorexia, arthralgia, or leg cramps noted. Current medication regimen is as listed below. He denies any side effects of medication, and has been taking it regularly. medication compliance:  compliant all of the time, diabetic diet compliance:  compliant most of the time, home glucose monitoring: are performed regularly, values range 150-170, further diabetic ROS: no polyuria or polydipsia, no chest pain, dyspnea or TIAs, no numbness, tingling or pain in extremities, last eye exam approximately 1 month ago.      Current Outpatient Medications   Medication Sig Dispense Refill   ??? latanoprost (XALATAN) 0.005 % ophthalmic solution INSTILL 1 DROP INTO EACH EYE EVERY DAY AT BEDTIME     ??? metFORMIN (GLUCOPHAGE) 1000 MG tablet Take 1 tablet by mouth 2 times daily (with meals) 180 tablet 1   ??? lisinopril (PRINIVIL;ZESTRIL) 20 MG tablet Take 1 tablet by mouth daily 90 tablet 1   ??? atorvastatin (LIPITOR) 20 MG tablet Take 1 tablet by mouth daily 90 tablet 1   ??? pioglitazone (ACTOS) 45 MG tablet Take 1 tablet by mouth daily 90 tablet 1   ??? Blood Glucose Monitoring Suppl (ACCU-CHEK AVIVA PLUS) W/DEVICE KIT   0   ??? ACCU-CHEK AVIVA PLUS strip   5   ??? Accu-Chek Softclix Lancets MISC   5     No current facility-administered medications for this visit.       No Known Allergies    Social History     Tobacco Use   ??? Smoking status: Former Smoker   ??? Smokeless tobacco: Never Used   ??? Tobacco comment: [place] 2011, but smoked since age 64,  2.5 ppd   Substance Use Topics   ??? Alcohol use: Yes          Objective:      BP 120/72 (Site: Left Upper Arm, Position: Sitting, Cuff Size: Large Adult)    Pulse 72    Temp 97.1 ??F (36.2 ??C)  (Infrared)    Wt 224 lb 3.2 oz (101.7 kg)    SpO2 97%    BMI 32.17 kg/m??   General: Alert and oriented, in no distress, obese  S1 and S2 normal, no murmurs, clicks, gallops or rubs. Regular rate and rhythm. Chest is clear; no wheezes or rales. No edema or JVD.  feet: normal DP and PT pulses, no trophic changes or ulcerative lesions and normal monofilament exam  A1c 7.4%     Assessment:        Diagnosis Orders   1. Type 2 diabetes mellitus without complication, without long-term current use of insulin (HCC)  POCT glycosylated hemoglobin (Hb A1C)   Controlled SITagliptin (JANUVIA) 100 MG tablet    HM DIABETES FOOT EXAM    metFORMIN (GLUCOPHAGE) 1000 MG tablet    pioglitazone (ACTOS) 45 MG tablet    Comprehensive Metabolic Panel   2. HTN, goal below 140/90  lisinopril (PRINIVIL;ZESTRIL) 20 MG tablet   Controlled asymptomatic Comprehensive Metabolic Panel   3. Hyperlipidemia associated with type 2 diabetes mellitus (HCC)  atorvastatin (LIPITOR) 20 MG tablet    Comprehensive Metabolic Panel   4. Class 1 obesity due to excess calories with serious comorbidity and body mass index (BMI) of 32.0 to 32.9 in adult  Needs improvement    5. Colon cancer screening  [facility] - Corinna Capra, MD, Gastroenterology, [place]          Plan:     We will add Januvia 100 mg daily to his current regimen.  1)  Medication: continue current medication regimen unchanged  2)  Recheck in 3 months, sooner should new symptoms or problems arise.     Christopher [name] received counseling on the following healthy behaviors: nutrition, exercise and medication adherence    Patient given educational materials on Diabetes    I have instructed Christopher [name] to complete a self tracking handout on Blood Sugars  and instructed them to bring it with them to his next appointment.     Discussed use, benefit, and side effects of prescribed medications.  Barriers to medication compliance addressed.  All patient questions answered.  Pt voiced understanding.

## 2020-12-26 NOTE — Patient Instructions (Signed)
Patient Education        Noninsulin Medicines for Type 2 Diabetes: Care Instructions  Overview     There are different types of noninsulin medicines for diabetes. Each works in a different way. But they all help you control your blood sugar. Some types help your body make insulin to lower your blood sugar. Others lower how much insulin your body needs. Some can slow how fast your body digests sugars. And some can remove extra glucose through your urine.  You may need to take more than one medicine for diabetes. Two or more medicines may work better to lower your blood sugar level than just one does.  ?? Metformin. This lowers how much glucose your liver makes. And it helps you respond better to insulin. It also lowers the amount of stored sugar that your liver releases when you are not eating.  ?? Sulfonylureas. These help your body release more insulin. Some work for many hours. They can cause low blood sugar if you don't eat as you planned. An example is glipizide.  ?? Thiazolidinediones. These reduce the amount of blood glucose. They also help you respond better to insulin. An example is pioglitazone.  ?? SGLT2 inhibitors. These help to remove extra glucose through your urine. They may also help some people lose weight. An example is ertugliflozin.  ?? DPP-4 inhibitors. These help your body raise the level of insulin after you eat. They also help your body make less of a hormone that raises blood sugar. An example is alogliptin.  ?? GLP-1 receptor agonists. These help your body make a protein that can raise your insulin level and make you less hungry. They're given as shots or pills. An example is semaglutide.  ?? Meglitinides. These help your body release insulin. They also help slow how your body digests sugars. So they can keep your blood sugar from rising too fast after you eat.  ?? Alpha-glucosidase inhibitors. These keep starches from breaking down. This means that they lower the amount of glucose absorbed when  you eat. They don't help your body make more insulin. So they will not cause low blood sugar unless you use them with other medicines for diabetes.  Follow-up care is a key part of your treatment and safety. Be sure to make and go to all appointments, and call your doctor if you are having problems. It's also a good idea to know your test results and keep a list of the medicines you take.  How can you care for yourself at home?  ?? Eat a healthy diet. Get some exercise each day. This may help you to reduce how much medicine you need.  ?? Do not take other prescription or over-the-counter medicines, vitamins, herbal products, or supplements without talking to your doctor first. Some medicines for type 2 diabetes can cause problems with other medicines or supplements.  ?? Tell your doctor if you plan to get pregnant. Some of these drugs are not safe for pregnant women.  ?? Be safe with medicines. Take your medicines exactly as prescribed. Meglitinides and sulfonylureas can cause your blood sugar to drop very low. Call your doctor if you think you are having a problem with your medicine.  ?? Check your blood sugar often. You can use a glucose monitor. Keeping track can help you know how certain foods, activities, and medicines affect your blood sugar. And it can help you keep your blood sugar from getting so low that it's not safe.  When should   you call for help?   Call 911 anytime you think you may need emergency care. For example, call if:  ?? ?? You passed out (lost consciousness).   ?? ?? You are confused or cannot think clearly.   ?? ?? Your blood sugar is very high or very low.   Watch closely for changes in your health, and be sure to contact your doctor if:  ?? ?? Your blood sugar stays outside the level your doctor set for you.   ?? ?? You have any problems.   Where can you learn more?  Go to [email or web address] and sign in to your MyChart account. Enter H153 in the Search Health Information box to learn  more about "Noninsulin Medicines for Type 2 Diabetes: Care Instructions."     If you do not have an account, please click on the "Sign Up Now" link.  Current as of: June 06, 2020??????????????????????????????Content Version: 13.1  ?? 2006-2021 [employer].   Care instructions adapted under license by [facility]. If you have questions about a medical condition or this instruction, always ask your healthcare professional. [employer], Inc[employer] disclaims any warranty or liability for your use of this information.

## 2020-12-28 LAB — COMPREHENSIVE METABOLIC PANEL
ALT: 42 U/L (ref 0–60)
AST: 31 U/L (ref 0–55)
Albumin/Globulin Ratio: 2.8 RATIO — ABNORMAL HIGH (ref 0.8–2.6)
Albumin: 5.1 G/DL (ref 3.5–5.2)
Alkaline Phosphatase: 69 U/L (ref 23–144)
BUN: 9 MG/DL (ref 3–29)
Bun/Cre Ratio: 10 (ref 7–25)
CO2: 24 MEQ/L (ref 19–32)
Calcium: 10 MG/DL (ref 8.5–10.5)
Chloride: 98 MEQ/L (ref 96–110)
Creatinine: 0.9 MG/DL (ref 0.5–1.4)
Globulin: 1.8 G/DL — ABNORMAL LOW (ref 1.9–3.6)
Glomerular Filtration Rate: 103 mL/min/{1.73_m2} (ref >60)
Glucose: 168 MG/DL — ABNORMAL HIGH (ref 70–99)
Potassium: 4.4 MEQ/L (ref 3.4–5.3)
Sodium: 137 MEQ/L (ref 135–148)
Total Bilirubin: 1 MG/DL (ref 0.0–1.2)
Total Protein: 6.9 G/DL (ref 6.0–8.3)

## 2021-03-11 ENCOUNTER — Other Ambulatory Visit: Payer: Self-pay

## 2021-03-12 ENCOUNTER — Encounter: Payer: Self-pay | Admitting: Internal Medicine

## 2021-03-12 ENCOUNTER — Ambulatory Visit: Payer: BC Managed Care – PPO | Admitting: Internal Medicine

## 2021-03-12 ENCOUNTER — Ambulatory Visit (INDEPENDENT_AMBULATORY_CARE_PROVIDER_SITE_OTHER): Payer: BC Managed Care – PPO

## 2021-03-12 VITALS — BP 128/76 | HR 66 | Temp 98.2°F | Ht 74.0 in | Wt 197.0 lb

## 2021-03-12 DIAGNOSIS — Z720 Tobacco use: Secondary | ICD-10-CM | POA: Diagnosis not present

## 2021-03-12 DIAGNOSIS — M545 Low back pain, unspecified: Secondary | ICD-10-CM | POA: Diagnosis not present

## 2021-03-12 DIAGNOSIS — J449 Chronic obstructive pulmonary disease, unspecified: Secondary | ICD-10-CM | POA: Diagnosis not present

## 2021-03-12 DIAGNOSIS — M546 Pain in thoracic spine: Secondary | ICD-10-CM

## 2021-03-12 DIAGNOSIS — J439 Emphysema, unspecified: Secondary | ICD-10-CM | POA: Diagnosis not present

## 2021-03-12 DIAGNOSIS — R059 Cough, unspecified: Secondary | ICD-10-CM

## 2021-03-12 DIAGNOSIS — G8929 Other chronic pain: Secondary | ICD-10-CM | POA: Diagnosis not present

## 2021-03-12 DIAGNOSIS — R918 Other nonspecific abnormal finding of lung field: Secondary | ICD-10-CM

## 2021-03-12 MED ORDER — TRELEGY ELLIPTA 100-62.5-25 MCG/INH IN AEPB
1.0000 | INHALATION_SPRAY | Freq: Every day | RESPIRATORY_TRACT | 3 refills | Status: AC
Start: 2021-03-12 — End: ?

## 2021-03-12 NOTE — Progress Notes (Signed)
Subjective:  Patient ID: Brent Francis, male    DOB: 08-17-1976  Age: 45 y.o. MRN: [id number]  CC: New Patient (Initial Visit) (Pt states that he has back pain at times.)  This visit occurred during the SARS-CoV-2 public health emergency.  Safety protocols were in place, including screening questions prior to the visit, additional usage of staff PPE, and extensive cleaning of exam room while observing appropriate contact time as indicated for disinfecting solutions.    HPI Brent Francis presents for establishing.   He loads trucks for living.  He complains of a several year history of pain in his thoracic spine.  He describes it as a sharp pain that increases with movement.  He has gotten some symptom relief with Motrin and IcyHot.  He also complains of chronic cough, wheezing, and shortness of breath.  He has been using his girlfriends inhaler with some relief.  He smokes cigarettes.  He denies chest pain or hemoptysis.  History Brent Francis has no past medical history on file.   He has a past surgical history that includes Knee surgery (Left).   His family history includes Cancer in his father.He reports that he has been smoking cigarettes. He has a 20.00 pack-year smoking history. He has never used smokeless tobacco. He reports previous alcohol use. He reports current drug use. Drug: Marijuana.  No outpatient medications prior to visit.   No facility-administered medications prior to visit.    ROS Review of Systems  Constitutional: Negative.  Negative for chills, diaphoresis, fatigue and fever.  HENT: Negative.   Eyes: Negative.   Respiratory: Positive for cough, shortness of breath and wheezing. Negative for choking, chest tightness and stridor.   Cardiovascular: Negative for chest pain, palpitations and leg swelling.  Gastrointestinal: Negative for abdominal pain, constipation, diarrhea, nausea and vomiting.  Endocrine: Negative.   Genitourinary: Negative.  Negative for difficulty  urinating and hematuria.  Musculoskeletal: Positive for back pain. Negative for arthralgias and myalgias.  Skin: Negative.   Neurological: Negative.  Negative for dizziness, weakness and light-headedness.  Hematological: Negative for adenopathy. Does not bruise/bleed easily.  Psychiatric/Behavioral: Negative.     Objective:  BP 128/76 (BP Location: Right Arm, Patient Position: Sitting, Cuff Size: Normal)   Pulse 66   Temp 98.2 F (36.8 C) (Oral)   Ht 6\' 2"  (1.88 m)   Wt 197 lb (89.4 kg)   SpO2 97%   BMI 25.29 kg/m   Physical Exam Vitals reviewed.  HENT:     Nose: Nose normal.     Mouth/Throat:     Mouth: Mucous membranes are moist.  Eyes:     General: No scleral icterus.    Conjunctiva/sclera: Conjunctivae normal.  Cardiovascular:     Rate and Rhythm: Normal rate and regular rhythm.     Heart sounds: No murmur heard.   Pulmonary:     Effort: Pulmonary effort is normal.     Breath sounds: No stridor. Examination of the right-middle field reveals rhonchi. Examination of the left-middle field reveals rhonchi. Rhonchi present. No wheezing or rales.  Abdominal:     General: Abdomen is flat. Bowel sounds are normal. There is no distension.     Palpations: Abdomen is soft. There is no hepatomegaly, splenomegaly or mass.     Tenderness: There is no abdominal tenderness.  Musculoskeletal:        General: Normal range of motion.     Cervical back: Normal and neck supple.     Thoracic back: Normal. No swelling, edema  or deformity. Normal range of motion.     Lumbar back: Normal.     Right lower leg: No edema.     Left lower leg: No edema.  Lymphadenopathy:     Cervical: No cervical adenopathy.  Skin:    General: Skin is warm and dry.     Findings: No rash.  Neurological:     General: No focal deficit present.     Mental Status: He is alert.  Psychiatric:        Mood and Affect: Mood normal.        Behavior: Behavior normal.     No results found for: WBC, HGB, HCT,  PLT, GLUCOSE, CHOL, TRIG, HDL, LDLDIRECT, LDLCALC, ALT, AST, NA, K, CL, CREATININE, BUN, CO2, TSH, PSA, INR, GLUF, HGBA1C, MICROALBUR   DG Chest 2 View  Result Date: 03/14/2021 CLINICAL DATA:  45 year old male with history of chronic lower mid back pain. Cough. EXAM: CHEST - 2 VIEW COMPARISON:  No priors. FINDINGS: Emphysematous changes are noted in the lung apices. No acute consolidative airspace disease. No pleural effusions. Two potential nodular densities projecting over the right mid to lower lung, largest of which measures approximately 1.7 cm. Neither of these are well appreciated on the lateral projection, suggesting that these could represent rib lesions. No pneumothorax. No evidence of pulmonary edema. Heart size is normal. Upper mediastinal contours are within normal limits. IMPRESSION: 1. No radiographic evidence of acute cardiopulmonary disease. 2. Possible right-sided pulmonary nodules. This is poorly evaluated on today's chest radiograph. Further evaluation with noncontrast chest CT is recommended to better evaluate these findings. 3. 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 These results will be called to the ordering clinician or representative by the Radiologist Assistant, and communication documented in the PACS or [employer]. Electronically Signed   By: Trudie Reed M.D.   On: 03/14/2021 10:28   DG Thoracic Spine W/Swimmers  Result Date: 03/14/2021 CLINICAL DATA:  Chronic mid and low back pain which is worst when the patient wakes up in the morning. EXAM: THORACIC SPINE - 3 VIEWS COMPARISON:  None. FINDINGS: There is no evidence of thoracic spine fracture. Alignment is normal with straightening of lordosis noted. No other significant bone abnormalities are identified. IMPRESSION: Thoracic spine straightening.  Otherwise negative. Electronically Signed   By: Drusilla Kanner M.D.   On: 03/14/2021 10:09     Assessment & Plan:   Brent Francis was seen today for new patient (initial  visit).  Diagnoses and all orders for this visit:  Cough- His chest x-ray is positive for ill-defined nodules on the right side.  He will undergo a noncontrasted CT scan. -     DG Chest 2 View; Future  Chronic midline thoracic back pain-exam and plain films are unremarkable. 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 -     Fluticasone-Umeclidin-Vilant (TRELEGY ELLIPTA) 100-62.5-25 MCG/INH AEPB; Inhale 1 puff into the lungs daily.  Multiple nodules of lung -     CT Chest Wo Contrast; Future  Abnormal findings on diagnostic imaging of lung -     CT Chest Wo Contrast; Future   I am having Brent Francis start on Trelegy Ellipta.  Meds ordered this encounter  Medications  . Fluticasone-Umeclidin-Vilant (TRELEGY ELLIPTA) 100-62.5-25 MCG/INH AEPB    Sig: Inhale 1 puff into the lungs daily.    Dispense:  3 each    Refill:  3     Follow-up: Return in about 6 weeks (around 04/23/2021).  Sanda Linger, MD

## 2021-03-12 NOTE — Patient Instructions (Signed)

## 2021-03-14 DIAGNOSIS — R918 Other nonspecific abnormal finding of lung field: Secondary | ICD-10-CM | POA: Insufficient documentation

## 2021-03-18 ENCOUNTER — Telehealth: Payer: Self-pay | Admitting: Internal Medicine

## 2021-03-18 NOTE — Telephone Encounter (Signed)
He needs to be seen  TJ 

## 2021-03-18 NOTE — Telephone Encounter (Signed)
Patient calling wondering if he can come in tomorrow, based off his x-ray note does he need an appointment? Or just labs? No labs are currently ordered

## 2021-03-19 NOTE — Telephone Encounter (Signed)
Pt is scheduled for 4pm tomorrow. He gets off work at 3.30pm.

## 2021-03-19 NOTE — Telephone Encounter (Signed)
yes

## 2021-03-20 ENCOUNTER — Encounter
Admit: 2021-03-20 | Discharge: 2021-03-20 | Payer: BLUE CROSS/BLUE SHIELD | Attending: Family Medicine | Primary: Family Medicine

## 2021-03-20 ENCOUNTER — Other Ambulatory Visit: Payer: Self-pay

## 2021-03-20 ENCOUNTER — Ambulatory Visit: Payer: BC Managed Care – PPO | Admitting: Internal Medicine

## 2021-03-20 ENCOUNTER — Encounter: Payer: Self-pay | Admitting: Internal Medicine

## 2021-03-20 VITALS — BP 112/66 | HR 87 | Temp 97.6°F | Ht 74.0 in | Wt 199.0 lb

## 2021-03-20 DIAGNOSIS — Z0001 Encounter for general adult medical examination with abnormal findings: Secondary | ICD-10-CM | POA: Diagnosis not present

## 2021-03-20 DIAGNOSIS — Z0000 Encounter for general adult medical examination without abnormal findings: Secondary | ICD-10-CM | POA: Diagnosis not present

## 2021-03-20 DIAGNOSIS — R918 Other nonspecific abnormal finding of lung field: Secondary | ICD-10-CM | POA: Diagnosis not present

## 2021-03-20 DIAGNOSIS — J449 Chronic obstructive pulmonary disease, unspecified: Secondary | ICD-10-CM

## 2021-03-20 DIAGNOSIS — E119 Type 2 diabetes mellitus without complications: Secondary | ICD-10-CM

## 2021-03-20 LAB — POCT GLYCOSYLATED HEMOGLOBIN (HGB A1C): Hemoglobin A1C: 6.4 %

## 2021-03-20 NOTE — Patient Instructions (Signed)

## 2021-03-20 NOTE — Progress Notes (Signed)
Subjective:  Patient ID: Brent Francis, male    DOB: 11-30-1975  Age: 45 y.o. MRN: [id number]  CC: Annual Exam  This visit occurred during the SARS-CoV-2 public health emergency.  Safety protocols were in place, including screening questions prior to the visit, additional usage of staff PPE, and extensive cleaning of exam room while observing appropriate contact time as indicated for disinfecting solutions.    HPI [name] presents for a CPX.   He returns for follow-up.  He recently complained of cough and stabbing lower back pain.  The cough has resolved with the use of a triple inhaler.  He continues to complain of stabbing pain in his lower back.  The plain x-ray of his lower back was normal.  The films of his chest were suspicious for nodules on the right side either in the lung parenchyma or in the rib cage.  He returns for follow-up.  Outpatient Medications Prior to Visit  Medication Sig Dispense Refill  . Fluticasone-Umeclidin-Vilant (TRELEGY ELLIPTA) 100-62.5-25 MCG/INH AEPB Inhale 1 puff into the lungs daily. 3 each 3   No facility-administered medications prior to visit.    ROS Review of Systems  Constitutional: Negative.  Negative for appetite change, diaphoresis and fatigue.  HENT: Negative.   Eyes: Negative.   Respiratory: Negative for cough, chest tightness, shortness of breath and wheezing.   Cardiovascular: Negative for chest pain, palpitations and leg swelling.  Gastrointestinal: Negative for abdominal pain, constipation, diarrhea, nausea and vomiting.  Endocrine: Negative.   Genitourinary: Negative.  Negative for difficulty urinating.  Musculoskeletal: Positive for back pain. Negative for arthralgias and myalgias.  Skin: Negative.  Negative for color change and pallor.  Neurological: Negative.  Negative for dizziness, weakness, light-headedness and headaches.  Hematological: Negative for adenopathy. Does not bruise/bleed easily.  Psychiatric/Behavioral:  Negative.     Objective:  BP 112/66 (BP Location: Right Arm, Patient Position: Sitting, Cuff Size: Large)   Pulse 87   Temp 97.6 F (36.4 C) (Oral)   Ht 6\' 2"  (1.88 m)   Wt 199 lb (90.3 kg)   SpO2 94%   BMI 25.55 kg/m   BP Readings from Last 3 Encounters:  03/20/21 112/66  03/12/21 128/76    Wt Readings from Last 3 Encounters:  03/20/21 199 lb (90.3 kg)  03/12/21 197 lb (89.4 kg)    Physical Exam Vitals reviewed. Exam conducted with a chaperone present (his wife).  HENT:     Nose: Nose normal.     Mouth/Throat:     Mouth: Mucous membranes are moist.  Eyes:     General: No scleral icterus.    Conjunctiva/sclera: Conjunctivae normal.  Cardiovascular:     Rate and Rhythm: Normal rate and regular rhythm.     Pulses: Normal pulses.     Heart sounds: No murmur heard.   Pulmonary:     Effort: Pulmonary effort is normal.     Breath sounds: No stridor. No wheezing, rhonchi or rales.  Abdominal:     General: Abdomen is flat. Bowel sounds are normal. There is no distension.     Palpations: Abdomen is soft. There is no hepatomegaly, splenomegaly or mass.     Tenderness: There is no abdominal tenderness.     Hernia: There is no hernia in the left inguinal area or right inguinal area.  Genitourinary:    Pubic Area: No rash.      Penis: Normal.      Testes: Normal.     Epididymis:  Right: Normal.     Left: Normal.     Prostate: Normal. Not enlarged, not tender and no nodules present.     Rectum: Normal. Guaiac result negative. No mass, tenderness, anal fissure, external hemorrhoid or internal hemorrhoid. Normal anal tone.  Musculoskeletal:        General: Normal range of motion.     Cervical back: Neck supple.     Right lower leg: No edema.     Left lower leg: No edema.  Lymphadenopathy:     Cervical: No cervical adenopathy.     Lower Body: No right inguinal adenopathy. No left inguinal adenopathy.  Skin:    General: Skin is warm and dry.  Neurological:      General: No focal deficit present.     Mental Status: He is alert.  Psychiatric:        Mood and Affect: Mood normal.        Behavior: Behavior normal.     Lab Results  Component Value Date   WBC 5.9 03/20/2021   HGB 13.1 03/20/2021   HCT 39.4 03/20/2021   PLT 248.0 03/20/2021   GLUCOSE 86 03/20/2021   CHOL 86 03/20/2021   TRIG 68.0 03/20/2021   HDL 35.70 (L) 03/20/2021   LDLCALC 37 03/20/2021   ALT 16 03/20/2021   AST 15 03/20/2021   NA 140 03/20/2021   K 4.4 03/20/2021   CL 106 03/20/2021   CREATININE 0.96 03/20/2021   BUN 17 03/20/2021   CO2 27 03/20/2021   PSA 0.40 03/20/2021    EXAM: CHEST - 2 VIEW  COMPARISON:  No priors.  FINDINGS: Emphysematous changes are noted in the lung apices. No acute consolidative airspace disease. No pleural effusions. Two potential nodular densities projecting over the right mid to lower lung, largest of which measures approximately 1.7 cm. Neither of these are well appreciated on the lateral projection, suggesting that these could represent rib lesions. No pneumothorax. No evidence of pulmonary edema. Heart size is normal. Upper mediastinal contours are within normal limits.  IMPRESSION: 1. No radiographic evidence of acute cardiopulmonary disease. 2. Possible right-sided pulmonary nodules. This is poorly evaluated on today's chest radiograph. Further evaluation with noncontrast chest CT is recommended to better evaluate these findings. 3. 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  These results will be called to the ordering clinician or representative by the Radiologist Assistant, and communication documented in the PACS or [employer].   Electronically Signed   By: Trudie Reed M.D.   On: 03/14/2021 10:28  Assessment & Plan:   [name] was seen today for annual exam.  Diagnoses and all orders for this visit:  Encounter for general adult medical examination with abnormal findings- Exam completed, labs reviewed,  vaccines reviewed, cancer screenings will be addressed, patient education was given. -     Lipid panel; Future -     PSA; Future -     Hepatitis C antibody; Future -     HIV Antibody (routine testing w rflx); Future -     PSA -     Lipid panel -     HIV Antibody (routine testing w rflx) -     Hepatitis C antibody  Multiple nodules of lung- His labs are reassuring.  He will undergo a noncontrasted CT to try to gain more information about the nodules. 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  Will continue Trelegy. -     CBC with Differential/Platelet; Future -     Basic metabolic panel; Future -     Hepatic function panel; Future -     C-reactive protein; Future -     C-reactive protein -     Hepatic function panel -     Basic metabolic panel -     CBC with Differential/Platelet   I am having Malachy Chamber maintain his Trelegy Ellipta.  No orders of the defined types were placed in this encounter.    Follow-up: Return in about 3 months (around 06/20/2021).  Sanda Linger, MD

## 2021-03-20 NOTE — Progress Notes (Signed)
Christopher [name] is a 45 y.o. male who presents for evaluation of hypertension, hyperlipidemia, and diabetes.Marland Kitchen He indicates that he is feeling well and denies any symptoms referable to his elevated blood pressure.   Specifically denies chest pain, palpitations, dyspnea, orthopnea, PND or peripheral edema.  No anorexia, arthralgia, or leg cramps noted. Current medication regimen is as listed below. He denies any side effects of medication, and has been taking it regularly. medication compliance:  compliant all of the time, diabetic diet compliance:  compliant most of the time, home glucose monitoring: are performed regularly, are usually normal, further diabetic ROS: no polyuria or polydipsia, no chest pain, dyspnea or TIAs, no numbness, tingling or pain in extremities, last eye exam approximately 4 months ago.    Current Outpatient Medications   Medication Sig Dispense Refill   ??? TRELEGY ELLIPTA 100-62.5-25 MCG/INH AEPB      ??? SITagliptin (JANUVIA) 100 MG tablet Take 1 tablet by mouth daily 90 tablet 1   ??? metFORMIN (GLUCOPHAGE) 1000 MG tablet Take 1 tablet by mouth 2 times daily (with meals) 180 tablet 1   ??? lisinopril (PRINIVIL;ZESTRIL) 20 MG tablet Take 1 tablet by mouth daily 90 tablet 1   ??? atorvastatin (LIPITOR) 20 MG tablet Take 1 tablet by mouth daily 90 tablet 1   ??? pioglitazone (ACTOS) 45 MG tablet Take 1 tablet by mouth daily 90 tablet 1   ??? latanoprost (XALATAN) 0.005 % ophthalmic solution INSTILL 1 DROP INTO EACH EYE EVERY DAY AT BEDTIME     ??? Blood Glucose Monitoring Suppl (ACCU-CHEK AVIVA PLUS) W/DEVICE KIT   0   ??? ACCU-CHEK AVIVA PLUS strip   5   ??? Accu-Chek Softclix Lancets MISC   5     No current facility-administered medications for this visit.       No Known Allergies    Social History     Tobacco Use   ??? Smoking status: Former Smoker   ??? Smokeless tobacco: Never Used   ??? Tobacco comment: [place] 2011, but smoked since age 42,  2.5 ppd   Substance Use Topics   ??? Alcohol use: Yes           Objective:      BP 116/74 (Site: Right Upper Arm, Position: Sitting, Cuff Size: Large Adult)    Pulse 58    Ht '5\' 10"'$  (1.778 m)    Wt 215 lb (97.5 kg)    SpO2 98%    BMI 30.85 kg/m??   General: Alert and oriented, in no distress   S1 and S2 normal, no murmurs, clicks, gallops or rubs. Regular rate and rhythm. Chest is clear; no wheezes or rales. No edema or JVD.  feet: normal DP and PT pulses, no trophic changes or ulcerative lesions and normal monofilament exam   A1c 6.4%  Assessment:      Essential hypertension - well controlled and stable  Hyperlipidemia - asymptomatic  Diabetes--well controlled and asymptomatic     Plan:       1)  Medication: continue current medication regimen unchanged  2)  Recheck in 3 months, sooner should new symptoms or problems arise.     Christopher [name] received counseling on the following healthy behaviors: nutrition, exercise and medication adherence    Patient given educational materials on Diabetes    I have instructed Christopher [name] to complete a self tracking handout on Blood Sugars  and instructed them to bring it with them to his next appointment.     Discussed  use, benefit, and side effects of prescribed medications.  Barriers to medication compliance addressed.  All patient questions answered.  Pt voiced understanding.

## 2021-03-20 NOTE — Patient Instructions (Signed)
Patient Education        Diabetes Foot Health: Care Instructions  Your Care Instructions     When you have diabetes, your feet need extra care and attention. Diabetes can damage the nerve endings and blood vessels in your feet, making you less likely to notice when your feet are injured. Diabetes also limits your body's ability to fight infection and get blood to areas that need it. If you get a minor foot injury, it could become an ulcer or a serious infection. With good foot care,you can prevent most of these problems.  Caring for your feet can be quick and easy. Most of the care can be done whenyou are bathing or getting ready for bed.  Follow-up care is a key part of your treatment and safety. Be sure to make and go to all appointments, and call your doctor if you are having problems. It's also a good idea to know your test results and keep alist of the medicines you take.  How can you care for yourself at home?  ??? Keep your blood sugar close to normal by watching what and how much you eat, monitoring blood sugar, taking medicines if prescribed, and getting regular exercise.  ??? Do not smoke. Smoking affects blood flow and can make foot problems worse. If you need help quitting, talk to your doctor about stop-smoking programs and medicines. These can increase your chances of quitting for good.  ??? Eat a diet that is low in fats. High fat intake can cause fat to build up in your blood vessels and decrease blood flow.  ??? Inspect your feet daily for blisters, cuts, cracks, or sores. If you cannot see well, use a mirror or have someone help you.  ??? Take care of your feet:  ? Wash your feet every day. Use warm (not hot) water. Check the water temperature with your wrists or other part of your body, not your feet.  ? Dry your feet well. Pat them dry. Do not rub the skin on your feet too hard. Dry well between your toes. If the skin on your feet stays moist, bacteria or a fungus can grow, which can lead to  infection.  ? Keep your skin soft. Use moisturizing skin cream to keep the skin on your feet soft and prevent calluses and cracks. But do not put the cream between your toes, and stop using any cream that causes a rash.  ? Clean underneath your toenails carefully. Do not use a sharp object to clean underneath your toenails. Use the blunt end of a nail file or other rounded tool.  ? Trim and file your toenails straight across to prevent ingrown toenails. Use a nail clipper, not scissors. Use an emery board to smooth the edges.  ??? Change socks daily. Socks without seams are best, because seams often rub the feet. You can find socks for people with diabetes from specialty catalogs.  ??? Look inside your shoes every day for things like gravel or torn linings, which could cause blisters or sores.  ??? Buy shoes that fit well:  ? Look for shoes that have plenty of space around the toes. This helps prevent bunions and blisters.  ? Try on shoes while wearing the kind of socks you will usually wear with the shoes.  ? Avoid plastic shoes. They may rub your feet and cause blisters. Good shoes should be made of materials that are flexible and breathable, such as leather or cloth.  ?   Break in new shoes slowly by wearing them for no more than an hour a day for several days. Take extra time to check your feet for red areas, blisters, or other problems after you wear new shoes.  ??? Do not go barefoot. Do not wear sandals, and do not wear shoes with very thin soles. Thin soles are easy to puncture. They also do not protect your feet from hot pavement or cold weather.  ??? Have your doctor check your feet during each visit. If you have a foot problem, see your doctor. Do not try to treat an early foot problem at home. Home remedies or treatments that you can buy without a prescription (such as corn removers) can be harmful.  ??? Always get early treatment for foot problems. A minor irritation can lead to a major problem if not properly cared  for early.  When should you call for help?   Call your doctor now or seek immediate medical care if:  ?? ??? You have a foot sore, an ulcer or break in the skin that is not healing after 4 days, bleeding corns or calluses, or an ingrown toenail.   ?? ??? You have blue or black areas, which can mean bruising or blood flow problems.   ?? ??? You have peeling skin or tiny blisters between your toes or cracking or oozing of the skin.   ?? ??? You have a fever for more than 24 hours and a foot sore.   ?? ??? You have new numbness or tingling in your feet that does not go away after you move your feet or change positions.   ?? ??? You have unexplained or unusual swelling of the foot or ankle.   Watch closely for changes in your health, and be sure to contact your doctor if:  ?? ??? You cannot do proper foot care.   Where can you learn more?  Go to [email or web address] and sign in to your MyChart account. Enter A739 in the Search Health Information box to learn more about "Diabetes Foot Health: Care Instructions."     If you do not have an account, please click on the "Sign Up Now" link.  Current as of: June 06, 2020??????????????????????????????Content Version: 13.2  ?? 2006-2022 [employer].   Care instructions adapted under license by [facility]. If you have questions about a medical condition or this instruction, always ask your healthcare professional. [employer] disclaims any warranty or liability for your use of this information.

## 2021-03-21 LAB — BASIC METABOLIC PANEL
BUN: 17 mg/dL (ref 6–23)
CO2: 27 mEq/L (ref 19–32)
Calcium: 9.3 mg/dL (ref 8.4–10.5)
Chloride: 106 mEq/L (ref 96–112)
Creatinine, Ser: 0.96 mg/dL (ref 0.40–1.50)
GFR: 95.48 mL/min (ref >60.00)
Glucose, Bld: 86 mg/dL (ref 70–99)
Potassium: 4.4 mEq/L (ref 3.5–5.1)
Sodium: 140 mEq/L (ref 135–145)

## 2021-03-21 LAB — LIPID PANEL
Cholesterol: 86 mg/dL (ref 0–200)
HDL: 35.7 mg/dL — ABNORMAL LOW (ref >39.00)
LDL Cholesterol: 37 mg/dL (ref 0–99)
NonHDL: 50.7
Total CHOL/HDL Ratio: 2
Triglycerides: 68 mg/dL (ref 0.0–149.0)
VLDL: 13.6 mg/dL (ref 0.0–40.0)

## 2021-03-21 LAB — CBC WITH DIFFERENTIAL/PLATELET
Basophils Absolute: 0.1 10*3/uL (ref 0.0–0.1)
Basophils Relative: 1.1 % (ref 0.0–3.0)
Eosinophils Absolute: 0.2 10*3/uL (ref 0.0–0.7)
Eosinophils Relative: 2.8 % (ref 0.0–5.0)
HCT: 39.4 % (ref 39.0–52.0)
Hemoglobin: 13.1 g/dL (ref 13.0–17.0)
Lymphocytes Relative: 38.7 % (ref 12.0–46.0)
Lymphs Abs: 2.3 10*3/uL (ref 0.7–4.0)
MCHC: 33.3 g/dL (ref 30.0–36.0)
MCV: 92.7 fl (ref 78.0–100.0)
Monocytes Absolute: 0.5 10*3/uL (ref 0.1–1.0)
Monocytes Relative: 8.1 % (ref 3.0–12.0)
Neutro Abs: 2.9 10*3/uL (ref 1.4–7.7)
Neutrophils Relative %: 49.3 % (ref 43.0–77.0)
Platelets: 248 10*3/uL (ref 150.0–400.0)
RBC: 4.25 Mil/uL (ref 4.22–5.81)
RDW: 15.5 % (ref 11.5–15.5)
WBC: 5.9 10*3/uL (ref 4.0–10.5)

## 2021-03-21 LAB — HEPATIC FUNCTION PANEL
ALT: 16 U/L (ref 0–53)
AST: 15 U/L (ref 0–37)
Albumin: 4.5 g/dL (ref 3.5–5.2)
Alkaline Phosphatase: 55 U/L (ref 39–117)
Bilirubin, Direct: 0.1 mg/dL (ref 0.0–0.3)
Total Bilirubin: 0.3 mg/dL (ref 0.2–1.2)
Total Protein: 7.2 g/dL (ref 6.0–8.3)

## 2021-03-21 LAB — C-REACTIVE PROTEIN: CRP: <1 mg/dL (ref 0.5–20.0)

## 2021-03-21 LAB — HEPATITIS C ANTIBODY
Hepatitis C Ab: NONREACTIVE
SIGNAL TO CUT-OFF: 0.01 (ref <1.00)

## 2021-03-21 LAB — HIV ANTIBODY (ROUTINE TESTING W REFLEX): HIV 1&2 Ab, 4th Generation: NONREACTIVE

## 2021-03-21 LAB — PSA: PSA: 0.4 ng/mL (ref 0.10–4.00)

## 2021-03-25 ENCOUNTER — Encounter: Payer: Self-pay | Admitting: Internal Medicine

## 2021-04-02 ENCOUNTER — Other Ambulatory Visit: Payer: Self-pay

## 2021-04-02 ENCOUNTER — Ambulatory Visit (INDEPENDENT_AMBULATORY_CARE_PROVIDER_SITE_OTHER)
Admission: RE | Admit: 2021-04-02 | Discharge: 2021-04-02 | Disposition: A | Discharge status: Home / Self Care | Payer: BC Managed Care – PPO | Source: Ambulatory Visit | Attending: Internal Medicine | Admitting: Internal Medicine

## 2021-04-02 DIAGNOSIS — J432 Centrilobular emphysema: Secondary | ICD-10-CM | POA: Diagnosis not present

## 2021-04-02 DIAGNOSIS — R918 Other nonspecific abnormal finding of lung field: Secondary | ICD-10-CM | POA: Diagnosis not present

## 2021-04-02 DIAGNOSIS — R911 Solitary pulmonary nodule: Secondary | ICD-10-CM | POA: Diagnosis not present

## 2021-04-02 DIAGNOSIS — I700 Atherosclerosis of aorta: Secondary | ICD-10-CM | POA: Diagnosis not present

## 2021-04-02 MED ORDER — SITAGLIPTIN PHOSPHATE 100 MG PO TABS
100 MG | ORAL_TABLET | Freq: Every day | ORAL | 0 refills | Status: DC
Start: 2021-04-02 — End: 2021-06-12

## 2021-04-02 NOTE — Telephone Encounter (Signed)
Patient is going out of town on Monday and will not have his Januvia mail order before he leaves.  Patient is asking if Januvia can be sent to [facility] on [address]

## 2021-04-02 NOTE — Telephone Encounter (Signed)
Spoke with patient to advise patient of bridge script being sent to the pharmacy

## 2021-04-02 NOTE — Telephone Encounter (Signed)
Bridge prescription sent to [facility] on [address]

## 2021-04-03 ENCOUNTER — Encounter: Payer: Self-pay | Admitting: Internal Medicine

## 2021-04-23 ENCOUNTER — Ambulatory Visit: Payer: BC Managed Care – PPO | Admitting: Internal Medicine

## 2021-05-22 ENCOUNTER — Ambulatory Visit: Payer: BC Managed Care – PPO | Admitting: Internal Medicine

## 2021-06-10 ENCOUNTER — Encounter

## 2021-06-12 ENCOUNTER — Ambulatory Visit
Admit: 2021-06-12 | Discharge: 2021-06-12 | Payer: BLUE CROSS/BLUE SHIELD | Attending: Family Medicine | Primary: Family Medicine

## 2021-06-12 ENCOUNTER — Encounter

## 2021-06-12 DIAGNOSIS — E119 Type 2 diabetes mellitus without complications: Secondary | ICD-10-CM

## 2021-06-12 LAB — POCT GLYCOSYLATED HEMOGLOBIN (HGB A1C): Hemoglobin A1C: 5.8 %

## 2021-06-12 MED ORDER — METFORMIN HCL 1000 MG PO TABS
1000 MG | ORAL_TABLET | Freq: Two times a day (BID) | ORAL | 1 refills | Status: DC
Start: 2021-06-12 — End: 2021-12-02

## 2021-06-12 MED ORDER — PIOGLITAZONE HCL 45 MG PO TABS
45 MG | ORAL_TABLET | Freq: Every day | ORAL | 1 refills | Status: DC
Start: 2021-06-12 — End: 2021-12-02

## 2021-06-12 MED ORDER — SITAGLIPTIN PHOSPHATE 100 MG PO TABS
100 MG | ORAL_TABLET | Freq: Every day | ORAL | 1 refills | Status: DC
Start: 2021-06-12 — End: 2021-12-02

## 2021-06-12 MED ORDER — ATORVASTATIN CALCIUM 20 MG PO TABS
20 MG | ORAL_TABLET | Freq: Every day | ORAL | 1 refills | Status: DC
Start: 2021-06-12 — End: 2021-12-02

## 2021-06-12 MED ORDER — LISINOPRIL 20 MG PO TABS
20 MG | ORAL_TABLET | Freq: Every day | ORAL | 1 refills | Status: DC
Start: 2021-06-12 — End: 2021-12-02

## 2021-06-12 NOTE — Progress Notes (Signed)
Christopher [name] is a 45 y.o. male who presents for evaluation of hypertension, hyperlipidemia, and diabetes.Marland Kitchen He indicates that he is feeling well and denies any symptoms referable to his elevated blood pressure.   Specifically denies chest pain, palpitations, dyspnea, orthopnea, PND or peripheral edema.  No anorexia, arthralgia, or leg cramps noted. Current medication regimen is as listed below. He denies any side effects of medication, and has been taking it regularly. medication compliance:  compliant all of the time, diabetic diet compliance:  compliant most of the time, home glucose monitoring: are performed regularly, are usually normal, further diabetic ROS: no polyuria or polydipsia, no chest pain, dyspnea or TIAs, no numbness, tingling or pain in extremities, last eye exam approximately 7 months ago.    Current Outpatient Medications   Medication Sig Dispense Refill    SITagliptin (JANUVIA) 100 MG tablet Take 1 tablet by mouth daily 30 tablet 0    SITagliptin (JANUVIA) 100 MG tablet Take 1 tablet by mouth daily 90 tablet 1    metFORMIN (GLUCOPHAGE) 1000 MG tablet Take 1 tablet by mouth 2 times daily (with meals) 180 tablet 1    lisinopril (PRINIVIL;ZESTRIL) 20 MG tablet Take 1 tablet by mouth daily 90 tablet 1    atorvastatin (LIPITOR) 20 MG tablet Take 1 tablet by mouth daily 90 tablet 1    pioglitazone (ACTOS) 45 MG tablet Take 1 tablet by mouth daily 90 tablet 1    latanoprost (XALATAN) 0.005 % ophthalmic solution INSTILL 1 DROP INTO EACH EYE EVERY DAY AT BEDTIME      Blood Glucose Monitoring Suppl (ACCU-CHEK AVIVA PLUS) W/DEVICE KIT   0    ACCU-CHEK AVIVA PLUS strip   5    Accu-Chek Softclix Lancets MISC   5     No current facility-administered medications for this visit.       No Known Allergies    Social History     Tobacco Use    Smoking status: Former    Smokeless tobacco: Never    Tobacco comments:     [place] 2011, but smoked since age 46,  2.5 ppd   Substance Use Topics    Alcohol use: Yes           Objective:      BP 118/74 (Site: Right Upper Arm, Position: Sitting, Cuff Size: Large Adult)    Pulse 77    Temp 96.9 ??F (36.1 ??C) (Infrared)    Ht 5' 10" (1.778 m)    Wt 211 lb (95.7 kg)    SpO2 97%    BMI 30.28 kg/m??   General: Alert and oriented, in no distress   S1 and S2 normal, no murmurs, clicks, gallops or rubs. Regular rate and rhythm. Chest is clear; no wheezes or rales. No edema or JVD.  feet: normal DP and PT pulses, no trophic changes or ulcerative lesions, and normal monofilament exam  A1c 5.8%     Assessment:        Diagnosis Orders   1. Type 2 diabetes mellitus without complication, without long-term current use of insulin (HCC)  POCT glycosylated hemoglobin (Hb A1C)   Well controlled SITagliptin (JANUVIA) 100 MG tablet    metFORMIN (GLUCOPHAGE) 1000 MG tablet    pioglitazone (ACTOS) 45 MG tablet    Comprehensive Metabolic Panel    Microalbumin / Creatinine Urine Ratio      2. HTN, goal below 140/90  lisinopril (PRINIVIL;ZESTRIL) 20 MG tablet   controlled Comprehensive Metabolic Panel  3. Hyperlipidemia associated with type 2 diabetes mellitus (HCC)  atorvastatin (LIPITOR) 20 MG tablet   asymptomatic Comprehensive Metabolic Panel    Lipid Panel      4. Colon cancer screening  POCT Fecal Immunochemical Test (FIT)             Plan:       1)  Medication: continue current medication regimen unchanged  2)  Recheck in 6 months, sooner should new symptoms or problems arise.     Christopher [name] received counseling on the following healthy behaviors: nutrition, exercise, and medication adherence    Patient given educational materials on Diabetes    I have instructed Christopher [name] to complete a self tracking handout on Blood Sugars  and instructed them to bring it with them to his next appointment.     Discussed use, benefit, and side effects of prescribed medications.  Barriers to medication compliance addressed.  All patient questions answered.  Pt voiced understanding.

## 2021-06-13 LAB — COMPREHENSIVE METABOLIC PANEL
ALT: 29 U/L (ref 0–60)
AST: 28 U/L (ref 0–55)
Albumin/Globulin Ratio: 3 RATIO — ABNORMAL HIGH (ref 0.8–2.6)
Albumin: 4.8 G/DL (ref 3.5–5.2)
Alkaline Phosphatase: 54 U/L (ref 23–144)
BUN: 15 MG/DL (ref 3–29)
Bun/Cre Ratio: 15 (ref 7–25)
CO2: 24 MEQ/L (ref 19–32)
Calcium: 9.8 MG/DL (ref 8.5–10.5)
Chloride: 103 MEQ/L (ref 96–110)
Creatinine: 1 MG/DL (ref 0.5–1.4)
Globulin: 1.6 G/DL — ABNORMAL LOW (ref 1.9–3.6)
Glomerular Filtration Rate: 95 mL/min/{1.73_m2} (ref >60)
Glucose: 121 MG/DL — ABNORMAL HIGH (ref 70–99)
Potassium: 3.7 MEQ/L (ref 3.4–5.3)
Sodium: 139 MEQ/L (ref 135–148)
Total Bilirubin: 1 MG/DL (ref 0.0–1.2)
Total Protein: 6.4 G/DL (ref 6.0–8.3)

## 2021-06-13 LAB — LIPID PANEL
Cholesterol: 108 MG/DL (ref <200)
HDL: 40 MG/DL
LDL Calculated: 45 MG/DL
Triglycerides: 116 MG/DL (ref <150)
VLDL: 23 MG/DL (ref 4–32)

## 2021-06-13 LAB — MICROALBUMIN / CREATININE URINE RATIO
Creatinine, Ur: 192.6 MG/DL
Microalbumin Creatinine Ratio: 4 MCG/MG CREAT. (ref <30)
Microalbumin, Random Urine: 740 ug/dL

## 2021-06-17 ENCOUNTER — Ambulatory Visit: Payer: BC Managed Care – PPO | Admitting: Internal Medicine

## 2021-07-23 ENCOUNTER — Encounter: Payer: Self-pay | Admitting: Internal Medicine

## 2021-07-23 ENCOUNTER — Other Ambulatory Visit: Payer: Self-pay

## 2021-07-23 ENCOUNTER — Ambulatory Visit (INDEPENDENT_AMBULATORY_CARE_PROVIDER_SITE_OTHER): Payer: BC Managed Care – PPO | Admitting: Internal Medicine

## 2021-07-23 VITALS — BP 114/68 | HR 85 | Temp 98.8°F | Resp 16 | Ht 74.0 in | Wt 196.0 lb

## 2021-07-23 DIAGNOSIS — M5442 Lumbago with sciatica, left side: Secondary | ICD-10-CM | POA: Insufficient documentation

## 2021-07-23 MED ORDER — ETODOLAC 200 MG PO CAPS
200.0000 mg | ORAL_CAPSULE | Freq: Three times a day (TID) | ORAL | 1 refills | Status: AC
Start: 2021-07-23 — End: ?

## 2021-07-23 NOTE — Patient Instructions (Signed)

## 2021-07-23 NOTE — Progress Notes (Signed)
Subjective:  Patient ID: Brent Francis, male    DOB: 1976-05-13  Age: 45 y.o. MRN: [id number]  CC: Back Pain  This visit occurred during the SARS-CoV-2 public health emergency.  Safety protocols were in place, including screening questions prior to the visit, additional usage of staff PPE, and extensive cleaning of exam room while observing appropriate contact time as indicated for disinfecting solutions.    HPI Brent Francis presents for a 1 week hx of LBP that radiates into his left hip. He denies T/I and has gotten some relief with tylenol. He denies LE N/W/T.  Outpatient Medications Prior to Visit  Medication Sig Dispense Refill   Fluticasone-Umeclidin-Vilant (TRELEGY ELLIPTA) 100-62.5-25 MCG/INH AEPB Inhale 1 puff into the lungs daily. 3 each 3   No facility-administered medications prior to visit.    ROS Review of Systems  Constitutional:  Negative for diaphoresis and fatigue.  HENT: Negative.    Eyes: Negative.   Respiratory:  Negative for cough, shortness of breath and wheezing.   Cardiovascular:  Negative for chest pain, palpitations and leg swelling.  Gastrointestinal:  Negative for abdominal pain, constipation, diarrhea and vomiting.  Endocrine: Negative.   Genitourinary: Negative.  Negative for difficulty urinating and frequency.  Musculoskeletal:  Positive for back pain. Negative for arthralgias, myalgias and neck pain.  Skin: Negative.   Neurological:  Negative for dizziness, weakness, light-headedness and headaches.  Hematological:  Negative for adenopathy. Does not bruise/bleed easily.  Psychiatric/Behavioral: Negative.     Objective:  BP 114/68 (BP Location: Right Arm, Patient Position: Sitting, Cuff Size: Large)   Pulse 85   Temp 98.8 F (37.1 C) (Oral)   Resp 16   Ht 6\' 2"  (1.88 m)   Wt 196 lb (88.9 kg)   SpO2 94%   BMI 25.16 kg/m   BP Readings from Last 3 Encounters:  07/23/21 114/68  03/20/21 112/66  03/12/21 128/76    Wt Readings from Last 3  Encounters:  07/23/21 196 lb (88.9 kg)  03/20/21 199 lb (90.3 kg)  03/12/21 197 lb (89.4 kg)    Physical Exam Vitals reviewed.  HENT:     Nose: Nose normal.     Mouth/Throat:     Mouth: Mucous membranes are moist.  Eyes:     General: No scleral icterus.    Conjunctiva/sclera: Conjunctivae normal.  Cardiovascular:     Rate and Rhythm: Normal rate and regular rhythm.     Heart sounds: No murmur heard. Pulmonary:     Effort: Pulmonary effort is normal.     Breath sounds: No stridor. No wheezing, rhonchi or rales.  Abdominal:     General: Abdomen is flat.     Palpations: There is no mass.     Tenderness: There is no abdominal tenderness. There is no guarding.     Hernia: No hernia is present.  Musculoskeletal:        General: Normal range of motion.     Cervical back: Normal and neck supple.     Thoracic back: Normal.     Lumbar back: Normal. No bony tenderness. Normal range of motion. Negative right straight leg raise test and negative left straight leg raise test.     Right lower leg: No edema.     Left lower leg: No edema.  Lymphadenopathy:     Cervical: No cervical adenopathy.  Skin:    General: Skin is warm and dry.     Coloration: Skin is not pale.  Neurological:     General:  No focal deficit present.     Mental Status: He is alert.     Motor: Motor function is intact.     Coordination: Coordination is intact.     Gait: Gait is intact.     Deep Tendon Reflexes: Reflexes are normal and symmetric.  Psychiatric:        Mood and Affect: Mood normal.        Behavior: Behavior normal.    Lab Results  Component Value Date   WBC 5.9 03/20/2021   HGB 13.1 03/20/2021   HCT 39.4 03/20/2021   PLT 248.0 03/20/2021   GLUCOSE 86 03/20/2021   CHOL 86 03/20/2021   TRIG 68.0 03/20/2021   HDL 35.70 (L) 03/20/2021   LDLCALC 37 03/20/2021   ALT 16 03/20/2021   AST 15 03/20/2021   NA 140 03/20/2021   K 4.4 03/20/2021   CL 106 03/20/2021   CREATININE 0.96 03/20/2021    BUN 17 03/20/2021   CO2 27 03/20/2021   PSA 0.40 03/20/2021    CT Chest Wo Contrast  Result Date: 04/03/2021 CLINICAL DATA:  45 year old male with pulmonary nodule seen on chest radiograph. EXAM: CT CHEST WITHOUT CONTRAST TECHNIQUE: Multidetector CT imaging of the chest was performed following the standard protocol without IV contrast. COMPARISON:  Chest radiograph dated 03/12/2021. FINDINGS: Evaluation of this exam is limited in the absence of intravenous contrast. Cardiovascular: There is no cardiomegaly or pericardial effusion. Minimal atherosclerotic calcification of the thoracic aorta. No aneurysmal dilatation. The central pulmonary arteries are grossly unremarkable on this noncontrast CT. Mediastinum/Nodes: No hilar or mediastinal adenopathy. The esophagus and the thyroid gland are grossly unremarkable. No mediastinal fluid collection. Lungs/Pleura: Background of centrilobular emphysema with biapical subpleural blebs and scarring. No nodule identified corresponding to the areas of concern on the prior radiograph. No focal consolidation, pleural effusion, or pneumothorax. The central airways are patent. Upper Abdomen: No acute abnormality. Musculoskeletal: No chest wall mass or suspicious bone lesions identified. IMPRESSION: 1. No acute intrathoracic pathology. No nodule identified corresponding to the areas of concern on the prior radiograph. 2. Background of centrilobular emphysema with biapical subpleural blebs and scarring. 3. Aortic Atherosclerosis (ICD10-I70.0) and Emphysema (ICD10-J43.9). Electronically Signed   By: Elgie Collard M.D.   On: 04/03/2021 16:25    Assessment & Plan:   Brent Francis was seen today for back pain.  Diagnoses and all orders for this visit:  Acute bilateral low back pain with left-sided sciatica- He has pain that radiates into his left hip but not beyond that.  He is neurologically intact.  There is no history of trauma or injury so I did not order plain films.  Will  add an NSAID to the Tylenol and I have asked him not to do any lifting for the next 2 weeks. -     etodolac (LODINE) 200 MG capsule; Take 1 capsule (200 mg total) by mouth every 8 (eight) hours.  I am having Brent Francis start on etodolac. I am also having him maintain his Trelegy Ellipta.  Meds ordered this encounter  Medications   etodolac (LODINE) 200 MG capsule    Sig: Take 1 capsule (200 mg total) by mouth every 8 (eight) hours.    Dispense:  90 capsule    Refill:  1     Follow-up: Return in about 3 months (around 10/22/2021).  Sanda Linger, MD

## 2021-12-02 ENCOUNTER — Encounter: Payer: BLUE CROSS/BLUE SHIELD | Attending: Family Medicine | Primary: Family Medicine

## 2021-12-02 LAB — POCT GLYCOSYLATED HEMOGLOBIN (HGB A1C): Hemoglobin A1C: 6.1 %

## 2021-12-02 MED ORDER — SITAGLIPTIN PHOSPHATE 100 MG PO TABS
100 MG | ORAL_TABLET | Freq: Every day | ORAL | 1 refills | Status: DC
Start: 2021-12-02 — End: 2022-05-22

## 2021-12-02 MED ORDER — METFORMIN HCL 1000 MG PO TABS
1000 MG | ORAL_TABLET | Freq: Two times a day (BID) | ORAL | 1 refills | Status: DC
Start: 2021-12-02 — End: 2022-05-22

## 2021-12-02 MED ORDER — PIOGLITAZONE HCL 45 MG PO TABS
45 MG | ORAL_TABLET | Freq: Every day | ORAL | 1 refills | Status: DC
Start: 2021-12-02 — End: 2022-05-22

## 2021-12-02 MED ORDER — ATORVASTATIN CALCIUM 20 MG PO TABS
20 MG | ORAL_TABLET | Freq: Every day | ORAL | 1 refills | Status: DC
Start: 2021-12-02 — End: 2022-05-22

## 2021-12-02 MED ORDER — LISINOPRIL 20 MG PO TABS
20 MG | ORAL_TABLET | Freq: Every day | ORAL | 1 refills | Status: AC
Start: 2021-12-02 — End: 2022-12-02

## 2021-12-02 NOTE — Progress Notes (Signed)
Christopher [name] is a 46 y.o. male who presents for evaluation of hypertension, hyperlipidemia, and diabetes.Marland Kitchen He indicates that he is feeling well and denies any symptoms referable to his elevated blood pressure.   Specifically denies chest pain, palpitations, dyspnea, orthopnea, PND or peripheral edema.  No anorexia, arthralgia, or leg cramps noted. Current medication regimen is as listed below. He denies any side effects of medication, and has been taking it regularly. medication compliance:  compliant all of the time, diabetic diet compliance:  compliant most of the time, home glucose monitoring: are performed regularly, values range 130-140, further diabetic ROS: no polyuria or polydipsia, no chest pain, dyspnea or TIAs, no numbness, tingling or pain in extremities, last eye exam approximately 1 year ago.    Current Outpatient Medications   Medication Sig Dispense Refill    SITagliptin (JANUVIA) 100 MG tablet Take 1 tablet by mouth in the morning. 90 tablet 1    metFORMIN (GLUCOPHAGE) 1000 MG tablet Take 1 tablet by mouth in the morning and 1 tablet in the evening. Take with meals. 180 tablet 1    lisinopril (PRINIVIL;ZESTRIL) 20 MG tablet Take 1 tablet by mouth in the morning. 90 tablet 1    atorvastatin (LIPITOR) 20 MG tablet Take 1 tablet by mouth in the morning. 90 tablet 1    pioglitazone (ACTOS) 45 MG tablet Take 1 tablet by mouth in the morning. 90 tablet 1    latanoprost (XALATAN) 0.005 % ophthalmic solution INSTILL 1 DROP INTO EACH EYE EVERY DAY AT BEDTIME      Blood Glucose Monitoring Suppl (ACCU-CHEK AVIVA PLUS) W/DEVICE KIT   0    ACCU-CHEK AVIVA PLUS strip   5    Accu-Chek Softclix Lancets MISC   5     No current facility-administered medications for this visit.       No Known Allergies    Social History     Tobacco Use    Smoking status: Former    Smokeless tobacco: Never    Tobacco comments:     quti 2011, but smoked since age 26,  2.5 ppd   Substance Use Topics    Alcohol use: Yes           Objective:      BP 136/82 (Site: Left Upper Arm, Position: Sitting, Cuff Size: Large Adult)    Pulse 64    Temp (!) 96.4 ??F (35.8 ??C) (Infrared)    Wt 212 lb 12.8 oz (96.5 kg)    SpO2 98%    BMI 30.53 kg/m??   General: Alert and oriented, in no distress   S1 and S2 normal, no murmurs, clicks, gallops or rubs. Regular rate and rhythm. Chest is clear; no wheezes or rales. No edema or JVD.  feet: normal DP and PT pulses, no trophic changes or ulcerative lesions, and normal monofilament exam   A1c 6.1%  Assessment:       Diagnosis Orders   1. Type 2 diabetes mellitus without complication, without long-term current use of insulin (HCC)  POCT glycosylated hemoglobin (Hb A1C)   Controlled HM DIABETES FOOT EXAM    Comprehensive Metabolic Panel    SITagliptin (JANUVIA) 100 MG tablet    metFORMIN (GLUCOPHAGE) 1000 MG tablet    pioglitazone (ACTOS) 45 MG tablet      2. HTN, goal below 140/90  Comprehensive Metabolic Panel   Controlled lisinopril (PRINIVIL;ZESTRIL) 20 MG tablet      3. Hyperlipidemia associated with type 2 diabetes mellitus ([facility])  Comprehensive Metabolic Panel   Asymptomatic atorvastatin (LIPITOR) 20 MG tablet             Plan:       1)  Medication: continue current medication regimen unchanged due to their effectiveness  2)  Recheck in 6 months, sooner should new symptoms or problems arise.

## 2022-01-25 LAB — COMPREHENSIVE METABOLIC PANEL
ALT: 35 U/L (ref 0–60)
AST: 28 U/L (ref 0–55)
Albumin/Globulin Ratio: 2.6 RATIO (ref 0.8–2.6)
Albumin: 5.1 G/DL (ref 3.5–5.2)
Alkaline Phosphatase: 61 U/L (ref 23–144)
BUN: 10 MG/DL (ref 3–29)
Bun/Cre Ratio: 10 (ref 7–25)
CO2: 27 MEQ/L (ref 19–32)
Calcium: 9.9 MG/DL (ref 8.5–10.5)
Chloride: 101 MEQ/L (ref 96–110)
Creatinine: 1 MG/DL (ref 0.5–1.4)
Globulin: 2 G/DL (ref 1.9–3.6)
Glomerular Filtration Rate: 94 mL/min/{1.73_m2} (ref >60)
Glucose: 94 MG/DL (ref 70–99)
Potassium: 3.9 MEQ/L (ref 3.4–5.3)
Sodium: 137 MEQ/L (ref 135–148)
Total Bilirubin: 1.2 MG/DL (ref 0.0–1.2)
Total Protein: 7.1 G/DL (ref 6.0–8.3)

## 2022-02-05 ENCOUNTER — Telehealth (INDEPENDENT_AMBULATORY_CARE_PROVIDER_SITE_OTHER): Payer: BC Managed Care – PPO | Admitting: Family Medicine

## 2022-02-05 ENCOUNTER — Other Ambulatory Visit: Payer: Self-pay

## 2022-02-05 ENCOUNTER — Encounter: Payer: Self-pay | Admitting: Family Medicine

## 2022-02-05 DIAGNOSIS — U071 COVID-19: Secondary | ICD-10-CM

## 2022-02-05 NOTE — Progress Notes (Signed)
? ?  Subjective:  ?Documentation for virtual telephone encounter. ? ?Interactive audio and video telecommunications were attempted between this provider and patient, however due to patient not having access to video capability.  We continued and completed visit with audio only. ? ?The patient was located at home. ?The provider was located in the office. ?The patient did consent to this visit and is aware of possible charges through their insurance for this visit. ? ?The other persons participating in this telemedicine service were none. ?Time spent on call was 13 minutes and in review of previous records >15 minutes total. ? ?This virtual service is not related to other E/M service within previous 7 days. ? ? ? Patient ID: Brent Francis, male    DOB: 05/02/1976, 46 y.o.   MRN: [id number] ? ?HPI ?Chief Complaint  ?Patient presents with  ? Covid Positive  ?  01/30/22 only sx is nasal drainage. Pt states he doesn't have signal so we will have to call him.  ? ?This is a virtual visit to discuss a return to work note per patient.  Reports symptom onset and positive home COVID test 6 days ago on 01/30/2022.  States he feels basically back to normal.  Only symptom now is rhinorrhea.  He is using a nasal spray. ? 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  Would like to return to work tomorrow. ? ?Denies fever, chills, headache, dizziness, chest pain, palpitations, shortness of breath, cough, abdominal pain, nausea, vomiting or diarrhea. ? ? ?Review of Systems ?Pertinent positives and negatives in the history of present illness. ? ?   ?Objective:  ? Physical Exam ?There were no vitals taken for this visit. ? ?Alert and oriented in no acute distress.  Speaking in complete sentences without difficulty. ? ? ?   ?Assessment & Plan:  ?COVID-19 virus infection ? ?Patient with COVID infection and recovering without any concerns.  His only symptom now is rhinorrhea.  We discussed using over-the-counter Claritin or Allegra as well as  continuing with his nasal steroid spray. ?Today is day 6 with COVID and I will provide him with a return to work note for tomorrow, 02/06/2022.  He was advised that he should continue wearing a mask if he is in close proximity of other people for the next 4 days.  ? ? ?

## 2022-03-28 LAB — HM DIABETES EYE EXAM: Diabetic Retinopathy: NEGATIVE

## 2022-05-12 ENCOUNTER — Encounter

## 2022-05-12 NOTE — Telephone Encounter (Signed)
LM advising pt to call back to schedule an appointment.

## 2022-05-12 NOTE — Telephone Encounter (Signed)
Patient needs an appointment

## 2022-05-12 NOTE — Telephone Encounter (Signed)
Left message for patient to call and schedule an appointment

## 2022-05-14 NOTE — Telephone Encounter (Signed)
Left message for patient to call back office

## 2022-05-14 NOTE — Telephone Encounter (Signed)
Patient needs an appointment

## 2022-05-15 NOTE — Telephone Encounter (Signed)
Left message for patient to call back office

## 2022-05-16 NOTE — Telephone Encounter (Signed)
Letter sent

## 2022-05-22 ENCOUNTER — Ambulatory Visit
Admit: 2022-05-22 | Discharge: 2022-05-22 | Payer: BLUE CROSS/BLUE SHIELD | Attending: Family Medicine | Primary: Family Medicine

## 2022-05-22 DIAGNOSIS — E119 Type 2 diabetes mellitus without complications: Secondary | ICD-10-CM

## 2022-05-22 LAB — POCT GLYCOSYLATED HEMOGLOBIN (HGB A1C): Hemoglobin A1C: 6.1 %

## 2022-05-22 MED ORDER — ATORVASTATIN CALCIUM 20 MG PO TABS
20 MG | ORAL_TABLET | Freq: Every day | ORAL | 1 refills | Status: DC
Start: 2022-05-22 — End: 2022-11-06

## 2022-05-22 MED ORDER — METFORMIN HCL 1000 MG PO TABS
1000 MG | ORAL_TABLET | Freq: Two times a day (BID) | ORAL | 1 refills | Status: AC
Start: 2022-05-22 — End: 2022-11-06

## 2022-05-22 MED ORDER — LISINOPRIL 20 MG PO TABS
20 MG | ORAL_TABLET | Freq: Every day | ORAL | 1 refills | Status: DC
Start: 2022-05-22 — End: 2022-11-06

## 2022-05-22 MED ORDER — PIOGLITAZONE HCL 45 MG PO TABS
45 MG | ORAL_TABLET | Freq: Every day | ORAL | 1 refills | Status: AC
Start: 2022-05-22 — End: 2022-11-06

## 2022-05-22 MED ORDER — SITAGLIPTIN PHOSPHATE 100 MG PO TABS
100 MG | ORAL_TABLET | Freq: Every day | ORAL | 1 refills | Status: DC
Start: 2022-05-22 — End: 2022-11-06

## 2022-05-22 NOTE — Progress Notes (Signed)
Christopher [name] is a 46 y.o. male who presents for evaluation of hypertension, hyperlipidemia, and diabetes.Marland Kitchen He indicates that he is feeling well and denies any symptoms referable to his elevated blood pressure.   Specifically denies chest pain, palpitations, dyspnea, orthopnea, PND or peripheral edema.  No anorexia, arthralgia, or leg cramps noted. Current medication regimen is as listed below. He denies any side effects of medication, and has been taking it regularly. medication compliance:  compliant all of the time, diabetic diet compliance:  compliant most of the time, home glucose monitoring: are performed regularly, are usually normal, further diabetic ROS: no polyuria or polydipsia, no chest pain, dyspnea or TIAs, no numbness, tingling or pain in extremities, last eye exam approximately 2 months ago    Current Outpatient Medications   Medication Sig Dispense Refill    SITagliptin (JANUVIA) 100 MG tablet Take 1 tablet by mouth daily 90 tablet 1    metFORMIN (GLUCOPHAGE) 1000 MG tablet Take 1 tablet by mouth 2 times daily (with meals) 180 tablet 1    lisinopril (PRINIVIL;ZESTRIL) 20 MG tablet Take 1 tablet by mouth daily 90 tablet 1    atorvastatin (LIPITOR) 20 MG tablet Take 1 tablet by mouth daily 90 tablet 1    pioglitazone (ACTOS) 45 MG tablet Take 1 tablet by mouth daily 90 tablet 1    latanoprost (XALATAN) 0.005 % ophthalmic solution INSTILL 1 DROP INTO EACH EYE EVERY DAY AT BEDTIME      Blood Glucose Monitoring Suppl (ACCU-CHEK AVIVA PLUS) W/DEVICE KIT   0    ACCU-CHEK AVIVA PLUS strip   5    Accu-Chek Softclix Lancets MISC   5     No current facility-administered medications for this visit.       No Known Allergies    Social History     Tobacco Use    Smoking status: Former    Smokeless tobacco: Never    Tobacco comments:     quti 2011, but smoked since age 61,  2.5 ppd   Substance Use Topics    Alcohol use: Yes          Objective:      BP 122/80 (Site: Left Upper Arm, Position: Sitting, Cuff Size:  Large Adult)   Pulse 58   Temp 96.8 F (36 C) (Infrared)   Wt 210 lb 12.8 oz (95.6 kg)   SpO2 97%   BMI 30.25 kg/m   General: Alert and oriented, in no distress   S1 and S2 normal, no murmurs, clicks, gallops or rubs. Regular rate and rhythm. Chest is clear; no wheezes or rales. No edema or JVD.  feet: normal DP and PT pulses, no trophic changes or ulcerative lesions, and normal monofilament exam  A1c 6.1%     Assessment:          Diagnosis Orders   1. Type 2 diabetes mellitus without complication, without long-term current use of insulin (HCC)  POCT glycosylated hemoglobin (Hb A1C)   Well-controlled SITagliptin (JANUVIA) 100 MG tablet    metFORMIN (GLUCOPHAGE) 1000 MG tablet    pioglitazone (ACTOS) 45 MG tablet    Comprehensive Metabolic Panel    Microalbumin / Creatinine Urine Ratio      2. HTN, goal below 140/90  lisinopril (PRINIVIL;ZESTRIL) 20 MG tablet   Well-controlled Comprehensive Metabolic Panel      3. Hyperlipidemia associated with type 2 diabetes mellitus (HCC)  atorvastatin (LIPITOR) 20 MG tablet   Asymptomatic Comprehensive Metabolic Panel    Lipid  Panel          Plan:       1)  Medication: continue current medication regimen unchanged due to effectiveness  2)  Recheck in 6 months, sooner should new symptoms or problems arise.

## 2022-05-23 LAB — MICROALBUMIN / CREATININE URINE RATIO
Creatinine, Ur: 114 mg/dL (ref 39.0–259.0)
Microalbumin, Random Urine: <1.2 mg/dL (ref <2.0)

## 2022-05-28 ENCOUNTER — Inpatient Hospital Stay: Payer: BLUE CROSS/BLUE SHIELD | Primary: Family Medicine

## 2022-05-28 DIAGNOSIS — I10 Essential (primary) hypertension: Secondary | ICD-10-CM

## 2022-05-28 LAB — COMPREHENSIVE METABOLIC PANEL
ALT: 30 U/L (ref 10–40)
AST: 31 IU/L (ref 15–37)
Albumin: 4.8 GM/DL (ref 3.4–5.0)
Alkaline Phosphatase: 50 IU/L (ref 40–128)
Anion Gap: 10 (ref 4–16)
BUN: 9 MG/DL (ref 6–23)
CO2: 24 MMOL/L (ref 21–32)
Calcium: 9.3 MG/DL (ref 8.3–10.6)
Chloride: 106 mMol/L (ref 99–110)
Creatinine: 0.8 MG/DL — ABNORMAL LOW (ref 0.9–1.3)
Est, Glom Filt Rate: >60 mL/min/{1.73_m2} (ref >60)
Glucose: 112 MG/DL — ABNORMAL HIGH (ref 70–99)
Potassium: 4.3 MMOL/L (ref 3.5–5.1)
Sodium: 140 MMOL/L (ref 135–145)
Total Bilirubin: 0.7 MG/DL (ref 0.0–1.0)
Total Protein: 6.4 GM/DL (ref 6.4–8.2)

## 2022-05-28 LAB — LIPID PANEL
Cholesterol: 105 MG/DL (ref <200)
HDL: 52 MG/DL (ref >40)
LDL Calculated: 40 MG/DL (ref <100)
Triglycerides: 66 MG/DL (ref <150)

## 2022-11-06 ENCOUNTER — Ambulatory Visit
Admit: 2022-11-06 | Discharge: 2022-11-06 | Payer: BLUE CROSS/BLUE SHIELD | Attending: Family Medicine | Primary: Family Medicine

## 2022-11-06 DIAGNOSIS — E119 Type 2 diabetes mellitus without complications: Secondary | ICD-10-CM

## 2022-11-06 LAB — POCT GLYCOSYLATED HEMOGLOBIN (HGB A1C): Hemoglobin A1C: 6.6 %

## 2022-11-06 MED ORDER — LISINOPRIL 20 MG PO TABS
20 MG | ORAL_TABLET | Freq: Every day | ORAL | 1 refills | Status: DC
Start: 2022-11-06 — End: 2023-04-22

## 2022-11-06 MED ORDER — METFORMIN HCL 1000 MG PO TABS
1000 MG | ORAL_TABLET | Freq: Two times a day (BID) | ORAL | 1 refills | Status: DC
Start: 2022-11-06 — End: 2023-04-22

## 2022-11-06 MED ORDER — SITAGLIPTIN PHOSPHATE 100 MG PO TABS
100 MG | ORAL_TABLET | Freq: Every day | ORAL | 1 refills | Status: DC
Start: 2022-11-06 — End: 2023-03-24

## 2022-11-06 MED ORDER — PIOGLITAZONE HCL 45 MG PO TABS
45 MG | ORAL_TABLET | Freq: Every day | ORAL | 1 refills | Status: DC
Start: 2022-11-06 — End: 2023-03-24

## 2022-11-06 MED ORDER — ATORVASTATIN CALCIUM 20 MG PO TABS
20 MG | ORAL_TABLET | Freq: Every day | ORAL | 1 refills | Status: DC
Start: 2022-11-06 — End: 2023-04-22

## 2022-11-06 NOTE — Progress Notes (Signed)
Christopher [name] is a 46 y.o. male who presents for evaluation of hypertension, hyperlipidemia, and diabetes.Marland Kitchen He indicates that he is feeling well and denies any symptoms referable to his elevated blood pressure.   Specifically denies chest pain, palpitations, dyspnea, orthopnea, PND or peripheral edema.  No anorexia, arthralgia, or leg cramps noted. Current medication regimen is as listed below. He denies any side effects of medication, and has been taking it regularly. medication compliance:  compliant all of the time, diabetic diet compliance:  compliant most of the time, home glucose monitoring: are performed regularly, values range 130-150s, further diabetic ROS: no polyuria or polydipsia, no chest pain, dyspnea or TIAs, no numbness, tingling or pain in extremities, last eye exam approximately less than 1 year ago    Current Outpatient Medications   Medication Sig Dispense Refill    SITagliptin (JANUVIA) 100 MG tablet Take 1 tablet by mouth daily 90 tablet 1    metFORMIN (GLUCOPHAGE) 1000 MG tablet Take 1 tablet by mouth 2 times daily (with meals) 180 tablet 1    lisinopril (PRINIVIL;ZESTRIL) 20 MG tablet Take 1 tablet by mouth daily 90 tablet 1    atorvastatin (LIPITOR) 20 MG tablet Take 1 tablet by mouth daily 90 tablet 1    pioglitazone (ACTOS) 45 MG tablet Take 1 tablet by mouth daily 90 tablet 1    latanoprost (XALATAN) 0.005 % ophthalmic solution INSTILL 1 DROP INTO EACH EYE EVERY DAY AT BEDTIME      Blood Glucose Monitoring Suppl (ACCU-CHEK AVIVA PLUS) W/DEVICE KIT   0    ACCU-CHEK AVIVA PLUS strip   5    Accu-Chek Softclix Lancets MISC   5     No current facility-administered medications for this visit.       No Known Allergies    Social History     Tobacco Use    Smoking status: Former    Smokeless tobacco: Never    Tobacco comments:     quti 2011, but smoked since age 31,  2.5 ppd   Substance Use Topics    Alcohol use: Yes          Objective:      BP 122/74 (Site: Left Upper Arm, Position: Sitting,  Cuff Size: Large Adult)   Pulse 59   Temp 97.3 F (36.3 C) (Infrared)   Ht 1.753 m (_0 )   Wt 97.2 kg (214 lb 3.2 oz)   SpO2 95%   BMI 31.63 kg/m   General: Alert and oriented, in no distress   S1 and S2 normal, no murmurs, clicks, gallops or rubs. Regular rate and rhythm. Chest is clear; no wheezes or rales. No edema or JVD.  feet: normal DP and PT pulses, no trophic changes or ulcerative lesions, and normal monofilament exam   A1c 6.6%  Assessment:       Diagnosis Orders   1. Type 2 diabetes mellitus without complication, without long-term current use of insulin (HCC)  POCT glycosylated hemoglobin (Hb A1C)   Well-controlled metFORMIN (GLUCOPHAGE) 1000 MG tablet    pioglitazone (ACTOS) 45 MG tablet    SITagliptin (JANUVIA) 100 MG tablet    Comprehensive Metabolic Panel      2. HTN, goal below 140/90  lisinopril (PRINIVIL;ZESTRIL) 20 MG tablet   Controlled Comprehensive Metabolic Panel      3. Hyperlipidemia associated with type 2 diabetes mellitus (HCC)  atorvastatin (LIPITOR) 20 MG tablet   Asymptomatic Comprehensive Metabolic Panel  Plan:       1)  Medication: continue current medication regimen unchanged due to effectiveness  2)  Recheck in 6 months, sooner should new symptoms or problems arise.

## 2022-11-07 LAB — COMPREHENSIVE METABOLIC PANEL
ALT: 33 U/L (ref 10–40)
AST: 27 U/L (ref 15–37)
Albumin/Globulin Ratio: 2.5 — ABNORMAL HIGH (ref 1.1–2.2)
Albumin: 4.9 g/dL (ref 3.4–5.0)
Alkaline Phosphatase: 87 U/L (ref 40–129)
Anion Gap: 13 (ref 3–16)
BUN: 10 mg/dL (ref 7–20)
CO2: 26 mmol/L (ref 21–32)
Calcium: 9.6 mg/dL (ref 8.3–10.6)
Chloride: 101 mmol/L (ref 99–110)
Creatinine: 0.8 mg/dL — ABNORMAL LOW (ref 0.9–1.3)
Est, Glom Filt Rate: >60 (ref >60)
Glucose: 102 mg/dL — ABNORMAL HIGH (ref 70–99)
Potassium: 4.3 mmol/L (ref 3.5–5.1)
Sodium: 140 mmol/L (ref 136–145)
Total Bilirubin: 1 mg/dL (ref 0.0–1.0)
Total Protein: 6.9 g/dL (ref 6.4–8.2)

## 2022-12-22 ENCOUNTER — Emergency Department (HOSPITAL_COMMUNITY)
Admission: EM | Admit: 2022-12-22 | Discharge: 2022-12-22 | Disposition: A | Discharge status: Home / Self Care | Payer: BC Managed Care – PPO | Attending: Emergency Medicine | Admitting: Emergency Medicine

## 2022-12-22 ENCOUNTER — Encounter (HOSPITAL_COMMUNITY): Payer: Self-pay | Admitting: Emergency Medicine

## 2022-12-22 ENCOUNTER — Emergency Department (HOSPITAL_COMMUNITY): Payer: BC Managed Care – PPO

## 2022-12-22 DIAGNOSIS — T148XXA Other injury of unspecified body region, initial encounter: Secondary | ICD-10-CM

## 2022-12-22 DIAGNOSIS — S39011A Strain of muscle, fascia and tendon of abdomen, initial encounter: Secondary | ICD-10-CM | POA: Diagnosis not present

## 2022-12-22 DIAGNOSIS — R109 Unspecified abdominal pain: Secondary | ICD-10-CM | POA: Diagnosis not present

## 2022-12-22 DIAGNOSIS — X58XXXA Exposure to other specified factors, initial encounter: Secondary | ICD-10-CM | POA: Insufficient documentation

## 2022-12-22 DIAGNOSIS — R1031 Right lower quadrant pain: Secondary | ICD-10-CM | POA: Diagnosis not present

## 2022-12-22 LAB — CBC WITH DIFFERENTIAL/PLATELET
Abs Immature Granulocytes: 0.02 10*3/uL (ref 0.00–0.07)
Basophils Absolute: 0 10*3/uL (ref 0.0–0.1)
Basophils Relative: 1 %
Eosinophils Absolute: 0.2 10*3/uL (ref 0.0–0.5)
Eosinophils Relative: 3 %
HCT: 43.5 % (ref 39.0–52.0)
Hemoglobin: 15 g/dL (ref 13.0–17.0)
Immature Granulocytes: 0 %
Lymphocytes Relative: 29 %
Lymphs Abs: 1.9 10*3/uL (ref 0.7–4.0)
MCH: 31.3 pg (ref 26.0–34.0)
MCHC: 34.5 g/dL (ref 30.0–36.0)
MCV: 90.6 fL (ref 80.0–100.0)
Monocytes Absolute: 0.4 10*3/uL (ref 0.1–1.0)
Monocytes Relative: 6 %
Neutro Abs: 4 10*3/uL (ref 1.7–7.7)
Neutrophils Relative %: 61 %
Platelets: 298 10*3/uL (ref 150–400)
RBC: 4.8 MIL/uL (ref 4.22–5.81)
RDW: 15.2 % (ref 11.5–15.5)
WBC: 6.5 10*3/uL (ref 4.0–10.5)
nRBC: 0 % (ref 0.0–0.2)

## 2022-12-22 LAB — COMPREHENSIVE METABOLIC PANEL
ALT: 14 U/L (ref 0–44)
AST: 12 U/L — ABNORMAL LOW (ref 15–41)
Albumin: 4.2 g/dL (ref 3.5–5.0)
Alkaline Phosphatase: 59 U/L (ref 38–126)
Anion gap: 10 (ref 5–15)
BUN: 13 mg/dL (ref 6–20)
CO2: 23 mmol/L (ref 22–32)
Calcium: 9.3 mg/dL (ref 8.9–10.3)
Chloride: 103 mmol/L (ref 98–111)
Creatinine, Ser: 0.83 mg/dL (ref 0.61–1.24)
GFR, Estimated: >60 mL/min (ref >60)
Glucose, Bld: 98 mg/dL (ref 70–99)
Potassium: 3.5 mmol/L (ref 3.5–5.1)
Sodium: 136 mmol/L (ref 135–145)
Total Bilirubin: 0.9 mg/dL (ref 0.3–1.2)
Total Protein: 7.9 g/dL (ref 6.5–8.1)

## 2022-12-22 LAB — LIPASE, BLOOD: Lipase: 29 U/L (ref 11–51)

## 2022-12-22 MED ORDER — HYDROMORPHONE HCL 1 MG/ML IJ SOLN
1.0000 mg | Freq: Once | INTRAMUSCULAR | Status: AC
  Administered 2022-12-22: 1 mg via INTRAVENOUS
  Filled 2022-12-22: qty 1

## 2022-12-22 MED ORDER — IOHEXOL 300 MG/ML  SOLN
100.0000 mL | Freq: Once | INTRAMUSCULAR | Status: AC | PRN
  Administered 2022-12-22: 100 mL via INTRAVENOUS

## 2022-12-22 MED ORDER — CYCLOBENZAPRINE HCL 10 MG PO TABS: 10.0000 mg | ORAL_TABLET | Freq: Two times a day (BID) | ORAL | 0 refills | Status: AC | PRN

## 2022-12-22 MED ORDER — SODIUM CHLORIDE 0.9 % IV BOLUS
1000.0000 mL | Freq: Once | INTRAVENOUS | Status: AC
  Administered 2022-12-22: 1000 mL via INTRAVENOUS

## 2022-12-22 NOTE — Discharge Instructions (Addendum)
Please pick up the muscle relaxer I prescribed for you.  You may take ibuprofen as needed for pain.  I have also attached a work note for you to return at the end of the week.  Please follow-up with your primary care provider to be seen regarding recent ER visit.  If symptoms worsen please return to ER.

## 2022-12-22 NOTE — ED Provider Notes (Signed)
[facility] Provider Note   CSN: [id number] Arrival date & time: 12/22/22  1017     History  Chief Complaint  Patient presents with   Abdominal Pain   *Wife was present to assist in history  Brent Francis is a 47 y.o. male with right lower quad pain that has been present for 4 days.  Patient states he does not know what he was doing when the pain began.  However the pain has been a 10 out of 10 constant pain.  Patient has not eaten since last Friday due to belly pain.  Patient has had episodes of nonbloody emesis but is not nauseous right now.  Patient endorsed night sweats, chills and does not know if he has had a fever.  Patient states he still has his gallbladder and appendix.  Patient has tried Gas-X without is not relieve symptoms.  Patient states he was constipated.  Patient denies chest pain, shortness of breath, syncope, dysuria  Home Medications Prior to Admission medications   Medication Sig Start Date End Date Taking? Authorizing Provider  cyclobenzaprine (FLEXERIL) 10 MG tablet Take 1 tablet (10 mg total) by mouth 2 (two) times daily as needed for muscle spasms. 12/22/22  Yes Chuck Hint, PA-C  etodolac (LODINE) 200 MG capsule Take 1 capsule (200 mg total) by mouth every 8 (eight) hours. Patient not taking: Reported on 12/22/2022 07/23/21   Janith Lima, MD  Fluticasone-Umeclidin-Vilant (TRELEGY ELLIPTA) 100-62.5-25 MCG/INH AEPB Inhale 1 puff into the lungs daily. Patient not taking: Reported on 12/22/2022 03/12/21   Janith Lima, MD      Allergies    Patient has no known allergies.    Review of Systems   Review of Systems  Gastrointestinal:  Positive for abdominal pain.  See HPI  Physical Exam Updated Vital Signs BP 122/86   Pulse 75   Temp 98.3 F (36.8 C) (Oral)   Resp (!) 23   Ht 6' 2"$  (1.88 m)   Wt 82.1 kg   SpO2 97%   BMI 23.23 kg/m  Physical Exam Vitals and nursing note reviewed.  Constitutional:       General: He is not in acute distress.    Appearance: He is well-developed.  HENT:     Head: Normocephalic and atraumatic.  Eyes:     Extraocular Movements: Extraocular movements intact.     Conjunctiva/sclera: Conjunctivae normal.     Pupils: Pupils are equal, round, and reactive to light.  Cardiovascular:     Rate and Rhythm: Normal rate and regular rhythm.     Heart sounds: No murmur heard.    Comments: 2+ bilateral radial/posterior tibialis pulses with regular rate Pulmonary:     Effort: Pulmonary effort is normal. No respiratory distress.     Breath sounds: Normal breath sounds.  Abdominal:     Palpations: Abdomen is soft.     Tenderness: There is abdominal tenderness in the right lower quadrant. There is no right CVA tenderness, left CVA tenderness, guarding or rebound. Positive signs include McBurney's sign and psoas sign.     Hernia: No hernia is present.  Musculoskeletal:        General: No swelling.     Cervical back: Normal range of motion and neck supple.     Comments: 5 out of 5 bilateral plantarflexion/dorsiflexion  Skin:    General: Skin is warm and dry.     Capillary Refill: Capillary refill takes less than 2 seconds.  Neurological:     General: No focal deficit present.     Mental Status: He is alert and oriented to person, place, and time.     GCS: GCS eye subscore is 4. GCS verbal subscore is 5. GCS motor subscore is 6.     Motor: Motor function is intact.     Comments: Station intact distally  Psychiatric:        Mood and Affect: Mood normal.     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 RADIATION DOSE REDUCTION: This exam was performed according to the departmental dose-optimization program which includes automated exposure control, adjustment of the mA and/or kV according to patient size and/or use of iterative reconstruction technique. CONTRAST:  155m OMNIPAQUE IOHEXOL 300 MG/ML  SOLN COMPARISON:  None Available. FINDINGS: Lower chest: No acute abnormality. Hepatobiliary: No focal liver abnormality is seen. No gallstones, gallbladder wall thickening, or biliary dilatation. Pancreas: Unremarkable. No pancreatic ductal dilatation or surrounding inflammatory changes. Spleen: Normal in size without focal abnormality. Adrenals/Urinary Tract: Adrenal glands are unremarkable. Kidneys are normal, without renal calculi, focal lesion, or hydronephrosis. Bladder is unremarkable. Stomach/Bowel: Stomach is within normal limits. Appendix appears normal. No evidence of bowel wall thickening, distention, or inflammatory changes. Vascular/Lymphatic: No significant vascular findings are present. No enlarged abdominal or pelvic lymph nodes. Reproductive: Prostate is unremarkable. Other: No abdominal wall hernia or abnormality. No abdominopelvic ascites. Musculoskeletal: Degenerative disc disease most pronounced in the upper lumbar spine. IMPRESSION: 1. No acute intra-abdominal pathology. No finding to explain the patient's right-sided abdominal pain. 2.  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 Electronically Signed   By: NAudie PintoM.D.   On: 12/22/2022 14:41    Procedures Procedures    Medications Ordered in ED Medications  sodium chloride  0.9 % bolus 1,000 mL (0 mLs Intravenous Stopped 12/22/22 1414)  HYDROmorphone (DILAUDID) injection 1 mg (1 mg Intravenous Given 12/22/22 1329)  iohexol (OMNIPAQUE) 300 MG/ML solution 100 mL (100 mLs Intravenous Contrast Given 12/22/22 1429)    ED Course/ Medical Decision Making/ A&P                             Medical Decision Making Amount and/or Complexity of Data Reviewed Labs: ordered. Radiology: ordered.  Risk Prescription drug management.   BBrodyn Royce89y.[email or web address]. presented today for right lower quad pain. Working DDx that I considered at this time includes, but not limited to, appendicitis, SBO, muscle strain, hernia, UTI.  Review of prior external notes: None  Unique Tests and My Interpretation:  Lipase: Unremarkable CMP: Unremarkable CBC: Unremarkable UA: Did not obtain CT abdomen pelvis with contrast: No acute intra-abdominal abnormalities  Discussion with Independent Historian: Wife  Discussion of Management of  Tests: None  Risk:    Medium:  - prescription drug management  Risk Stratification Score: None  Staffed with Dartha Lodge, MD  R/o DDx: Appendicitis/SBO/hernia: CT was negative UTI: patient did not endorse any urinary symptoms  Plan: Resenting with right lower quad pain has been present since last Thursday.  Patient had stable vitals and that he was slightly tachypneic at 23 breaths/min.  Patient was not in any distress.  Patient had pain in the right lower quadrant and positive McBurney's and psoas sign.  CT was obtained to evaluate for SBO or appendicitis.  Patient also be given Dilaudid for pain management and fluids.  Labs drawn at this time are unremarkable.  Patient condition improved after receiving Dilaudid.  CT was negative for any intra-abdominal changes.  Patient's overall presentation has improved and wife stated that she has been giving him Flexeril which has helped with the pain.  I have a high suspicion this is musculoskeletal in nature and  the constipation is due to marijuana use.  Patient states when asked about fevers that he got up to go to the bathroom and felt warm.  I suspect this is more vagal than fever and spoke with the patient about this.  Patient this time seems reasonable for discharge and outpatient follow-up.  At this time as patient has not endorsed any urinary symptoms and the nature of patient's pain has become more clear the urinary analysis will be discontinued.  I will prescribe Flexeril and give a work note.  Patient verbalized agreement this plan.         Final Clinical Impression(s) / ED Diagnoses Final diagnoses:  Muscle strain    Rx / DC Orders ED Discharge Orders          Ordered    cyclobenzaprine (FLEXERIL) 10 MG tablet  2 times daily PRN        12/22/22 1620              Elvina Sidle 12/24/22 0957    Fransico Meadow, MD 12/28/22 [place]

## 2022-12-22 NOTE — ED Notes (Signed)
Patient transported to CT 

## 2022-12-22 NOTE — ED Triage Notes (Signed)
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

## 2023-02-10 IMAGING — CT CT CHEST W/O CM
2 of 4 series · 15 of 36 positions shown, 18 images · non-contrast
Comparison: Chest radiograph dated 03/12/2021.

CLINICAL DATA: 44-year-old male with pulmonary nodule seen on chest
radiograph.

EXAM:
CT CHEST WITHOUT CONTRAST
TECHNIQUE: Multidetector CT imaging of the chest was performed following the
standard protocol without IV contrast.

[Series 2: thorax · axial · 0.67mm/px · z∈[-363,-57]mm · 12 of 181 slices shown, 15 images]
[im 14/181  mediastinal]
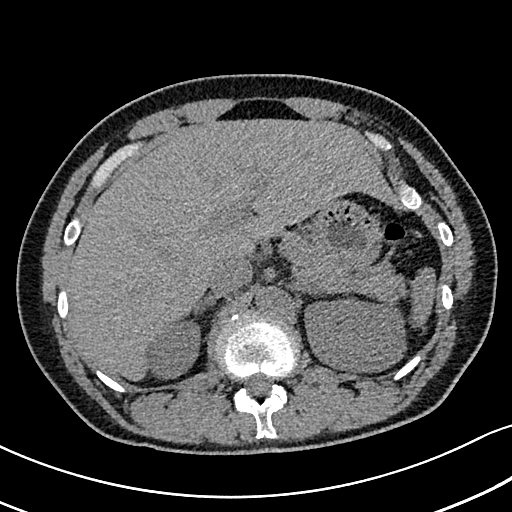
[im 14/181  lung]
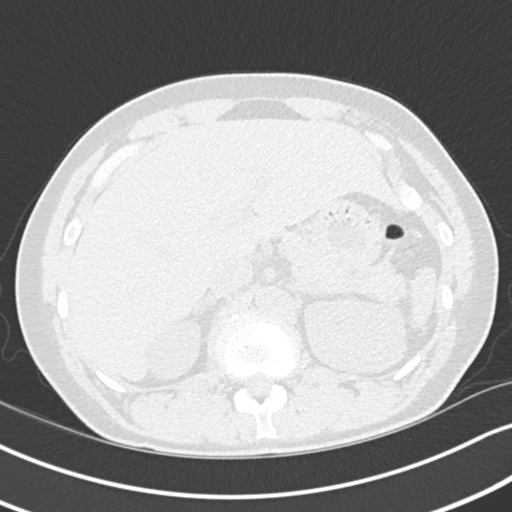
[im 28/181  lung]
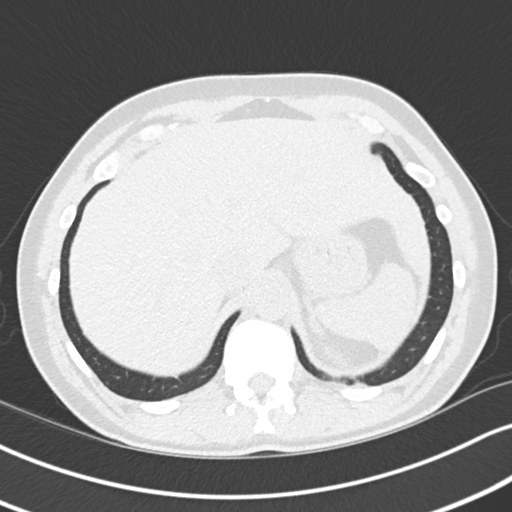
[im 42/181  lung]
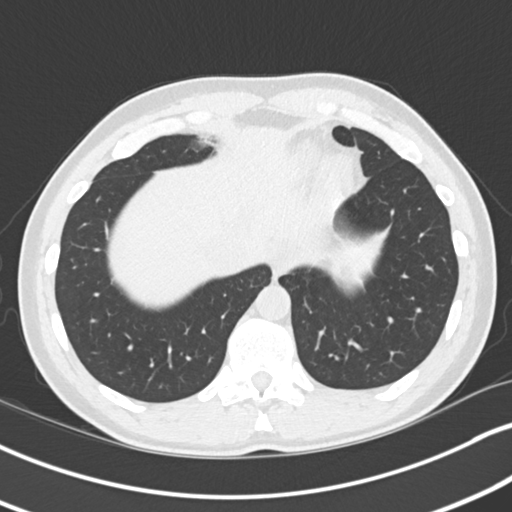
[im 56/181  lung]
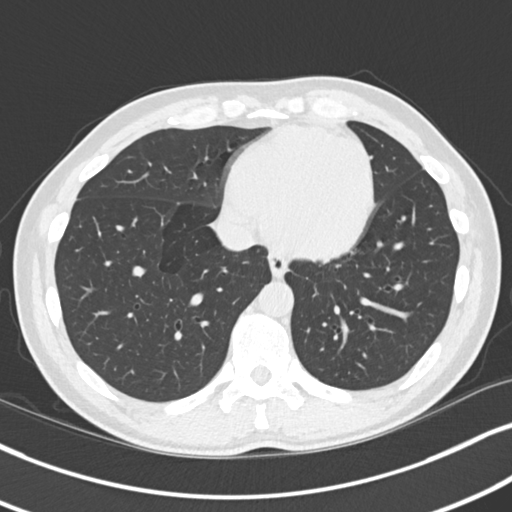
[im 70/181  mediastinal]
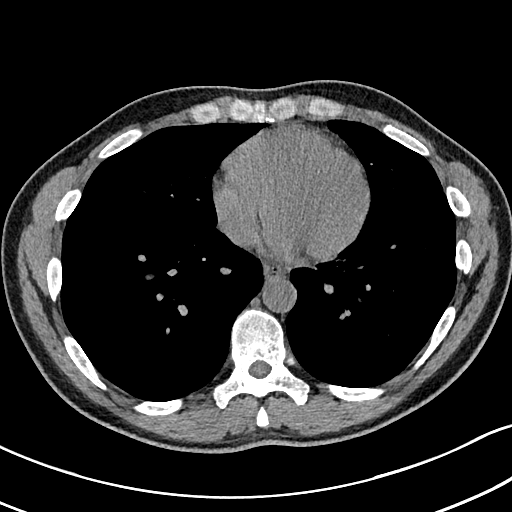
[im 70/181  lung]
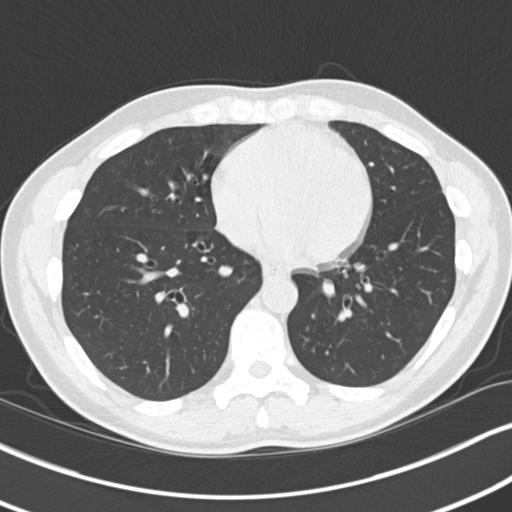
[im 84/181  lung]
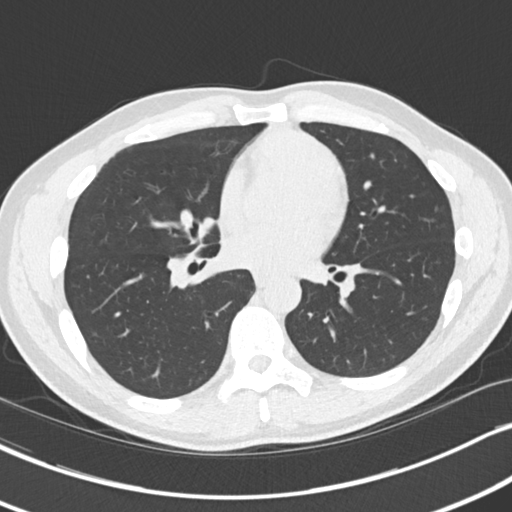
[im 97/181  lung]
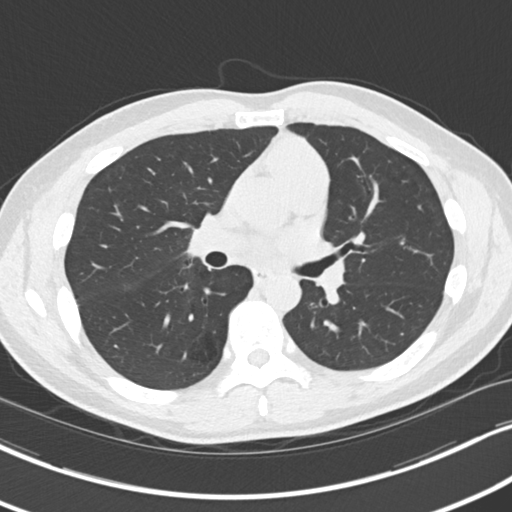
[im 111/181  lung]
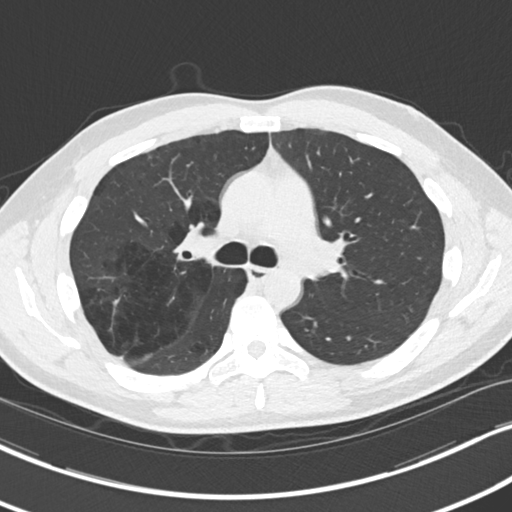
[im 125/181  mediastinal]
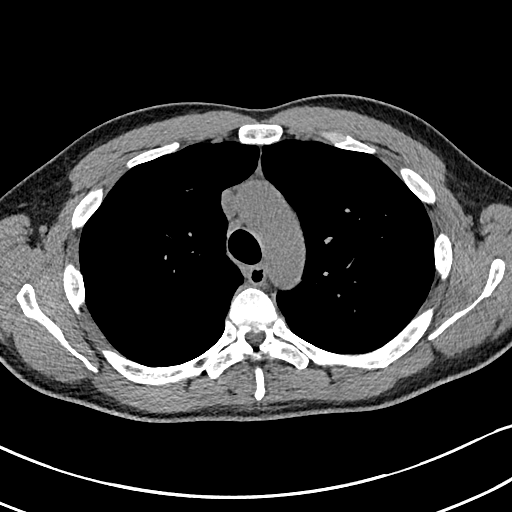
[im 125/181  lung]
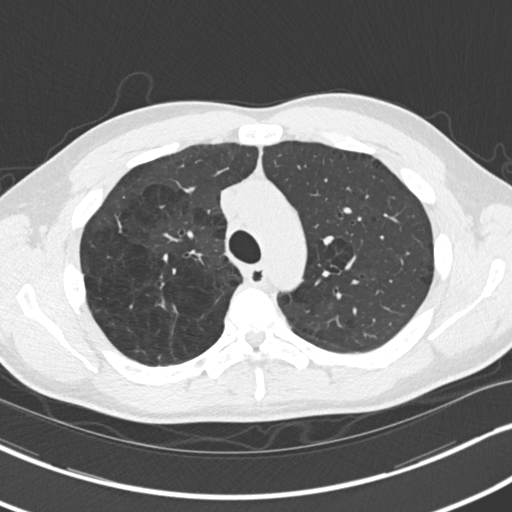
[im 139/181  lung]
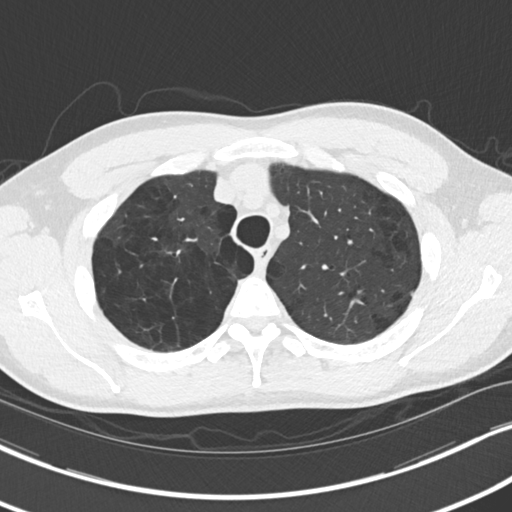
[im 153/181  lung]
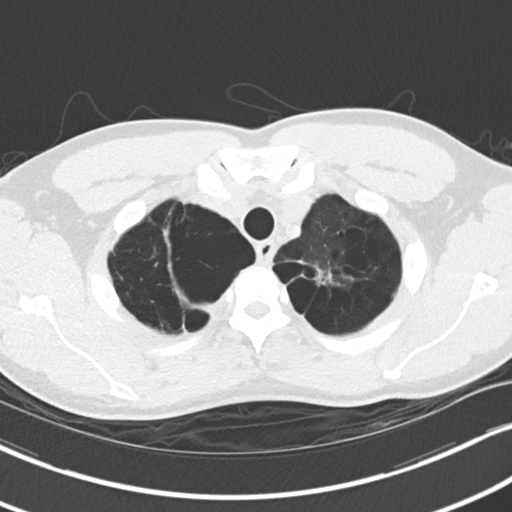
[im 167/181  lung]
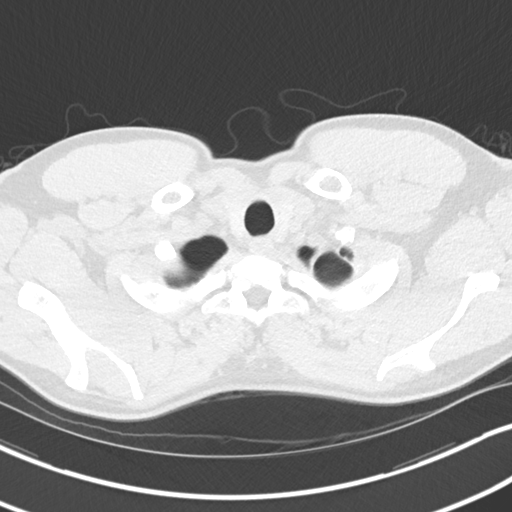

[Series 5: coronal · coronal · 0.71mm/px · 3 of 120 slices shown]
[im 24/120  lung]
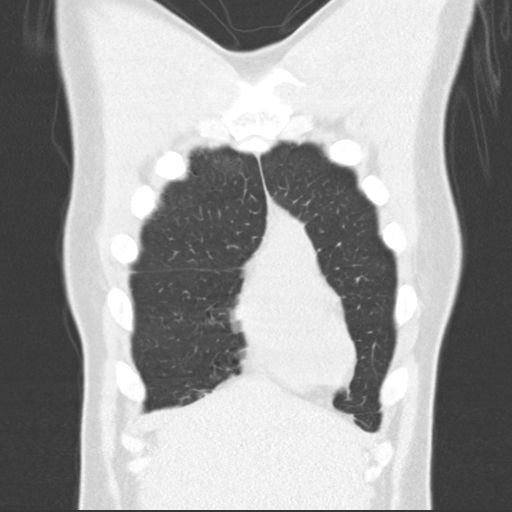
[im 48/120  lung]
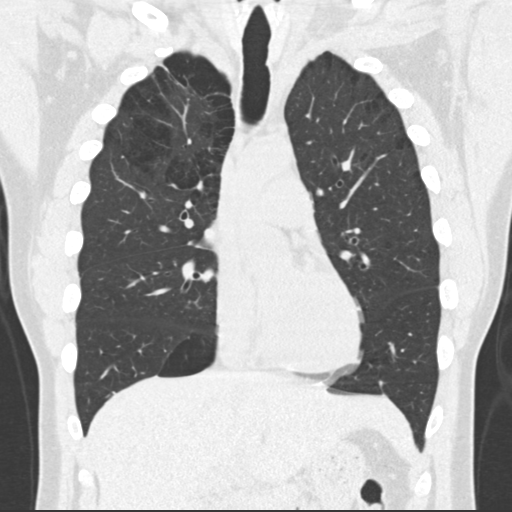
[im 72/120  lung]
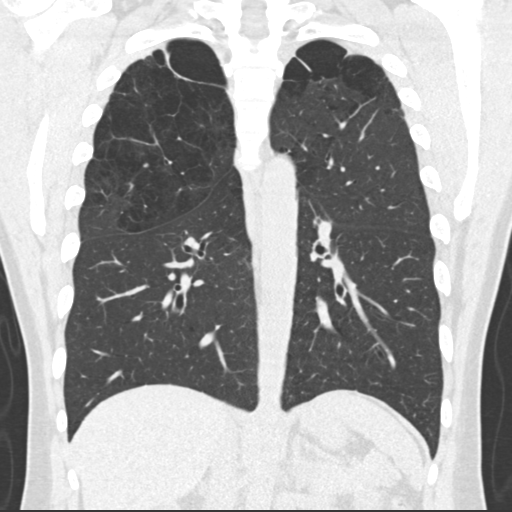

[15 of 36 positions shown; findings below may reference images not displayed]

FINDINGS: Evaluation of this exam is limited in the absence of intravenous
contrast.

Cardiovascular: There is no cardiomegaly or pericardial effusion.
Minimal atherosclerotic calcification of the thoracic aorta. No
aneurysmal dilatation. The central pulmonary arteries are grossly
unremarkable on this noncontrast CT.

Mediastinum/Nodes: No hilar or mediastinal adenopathy. The esophagus
and the thyroid gland are grossly unremarkable. No mediastinal fluid
collection.

Lungs/Pleura: Background of centrilobular emphysema with biapical
subpleural blebs and scarring. No nodule identified corresponding to
the areas of concern on the prior radiograph. No focal
consolidation, pleural effusion, or pneumothorax. The central
airways are patent.

Upper Abdomen: No acute abnormality.

Musculoskeletal: No chest wall mass or suspicious bone lesions
identified.
IMPRESSION: 1. No acute intrathoracic pathology. No nodule identified
corresponding to the areas of concern on the prior radiograph.
2. Background of centrilobular emphysema with biapical subpleural
blebs and scarring.
3. Aortic Atherosclerosis (AE4VF-E9R.R) and Emphysema (AE4VF-JDC.R).

## 2023-03-23 ENCOUNTER — Encounter

## 2023-03-24 MED ORDER — JANUVIA 100 MG PO TABS
100 MG | ORAL_TABLET | Freq: Every day | ORAL | 0 refills | Status: DC
Start: 2023-03-24 — End: 2023-04-22

## 2023-03-24 MED ORDER — PIOGLITAZONE HCL 45 MG PO TABS
45 MG | ORAL_TABLET | Freq: Every day | ORAL | 0 refills | Status: DC
Start: 2023-03-24 — End: 2023-04-22

## 2023-04-22 ENCOUNTER — Encounter
Admit: 2023-04-22 | Discharge: 2023-04-22 | Payer: BLUE CROSS/BLUE SHIELD | Attending: Family Medicine | Primary: Family Medicine

## 2023-04-22 DIAGNOSIS — E119 Type 2 diabetes mellitus without complications: Secondary | ICD-10-CM

## 2023-04-22 LAB — POCT GLYCOSYLATED HEMOGLOBIN (HGB A1C): Hemoglobin A1C: 6.2 %

## 2023-04-22 MED ORDER — LISINOPRIL 20 MG PO TABS
20 MG | ORAL_TABLET | Freq: Every day | ORAL | 1 refills | Status: AC
Start: 2023-04-22 — End: 2024-04-21

## 2023-04-22 MED ORDER — METFORMIN HCL 1000 MG PO TABS
1000 MG | ORAL_TABLET | Freq: Two times a day (BID) | ORAL | 1 refills | Status: AC
Start: 2023-04-22 — End: 2023-10-07

## 2023-04-22 MED ORDER — SITAGLIPTIN PHOSPHATE 100 MG PO TABS
100 MG | ORAL_TABLET | Freq: Every day | ORAL | 1 refills | Status: AC
Start: 2023-04-22 — End: 2023-10-07

## 2023-04-22 MED ORDER — PIOGLITAZONE HCL 45 MG PO TABS
45 MG | ORAL_TABLET | Freq: Every day | ORAL | 1 refills | Status: AC
Start: 2023-04-22 — End: 2023-10-07

## 2023-04-22 MED ORDER — ATORVASTATIN CALCIUM 20 MG PO TABS
20 MG | ORAL_TABLET | Freq: Every day | ORAL | 1 refills | Status: AC
Start: 2023-04-22 — End: 2023-10-07

## 2023-04-22 NOTE — Progress Notes (Signed)
Christopher [name] is a 47 y.o. male who presents for evaluation of hypertension, hyperlipidemia, and diabetes.Marland Kitchen He indicates that he is feeling well and denies any symptoms referable to his elevated blood pressure.   Specifically denies chest pain, palpitations, dyspnea, orthopnea, PND or peripheral edema.  No anorexia, arthralgia, or leg cramps noted. Current medication regimen is as listed below. He denies any side effects of medication, and has been taking it regularly. medication compliance:  compliant all of the time, diabetic diet compliance:  compliant most of the time, home glucose monitoring: are performed regularly, are usually normal, further diabetic ROS: no polyuria or polydipsia, no chest pain, dyspnea or TIAs, no numbness, tingling or pain in extremities, last eye exam approximately 1 year ago    Current Outpatient Medications   Medication Sig Dispense Refill    JANUVIA 100 MG tablet Take 1 tablet by mouth daily. 90 tablet 0    pioglitazone (ACTOS) 45 MG tablet Take 1 tablet by mouth daily. 90 tablet 0    atorvastatin (LIPITOR) 20 MG tablet Take 1 tablet by mouth daily 90 tablet 1    lisinopril (PRINIVIL;ZESTRIL) 20 MG tablet Take 1 tablet by mouth daily 90 tablet 1    metFORMIN (GLUCOPHAGE) 1000 MG tablet Take 1 tablet by mouth 2 times daily (with meals) 180 tablet 1    latanoprost (XALATAN) 0.005 % ophthalmic solution INSTILL 1 DROP INTO EACH EYE EVERY DAY AT BEDTIME      Blood Glucose Monitoring Suppl (ACCU-CHEK AVIVA PLUS) W/DEVICE KIT   0    ACCU-CHEK AVIVA PLUS strip   5    Accu-Chek Softclix Lancets MISC   5     No current facility-administered medications for this visit.       No Known Allergies    Social History     Tobacco Use    Smoking status: Former    Smokeless tobacco: Never    Tobacco comments:     quti 2011, but smoked since age 23,  2.5 ppd   Substance Use Topics    Alcohol use: Yes          Objective:      BP 128/76 (Site: Left Upper Arm, Position: Sitting, Cuff Size: Medium Adult)    Pulse 98   Temp 97.3 F (36.3 C) (Infrared)   Wt 90.2 kg (198 lb 12.8 oz)   SpO2 94%   BMI 29.36 kg/m   General: Alert and oriented, in no distress   S1 and S2 normal, no murmurs, clicks, gallops or rubs. Regular rate and rhythm. Chest is clear; no wheezes or rales. No edema or JVD.  feet: normal DP and PT pulses, no trophic changes or ulcerative lesions, and normal monofilament exam  A1c 6.2%     Assessment:       Diagnosis Orders   1. Type 2 diabetes mellitus without complication, without long-term current use of insulin (HCC)  POCT glycosylated hemoglobin (Hb A1C)   Well-controlled SITagliptin (JANUVIA) 100 MG tablet    metFORMIN (GLUCOPHAGE) 1000 MG tablet    pioglitazone (ACTOS) 45 MG tablet    Comprehensive Metabolic Panel    Microalbumin / Creatinine Urine Ratio    HM DIABETES FOOT EXAM      2. HTN, goal below 140/90  lisinopril (PRINIVIL;ZESTRIL) 20 MG tablet   Controlled Comprehensive Metabolic Panel      3. Hyperlipidemia associated with type 2 diabetes mellitus (HCC)  atorvastatin (LIPITOR) 20 MG tablet   Asymptomatic Comprehensive Metabolic Panel  Lipid Panel             Plan:       1)  Medication: continue current medication regimen unchanged due to effectiveness  2)  Recheck in 6 months, sooner should new symptoms or problems arise.

## 2023-04-23 LAB — COMPREHENSIVE METABOLIC PANEL
ALT: 26 U/L (ref 10–40)
AST: 25 U/L (ref 15–37)
Albumin/Globulin Ratio: 2.5 — ABNORMAL HIGH (ref 1.1–2.2)
Albumin: 4.8 g/dL (ref 3.4–5.0)
Alkaline Phosphatase: 61 U/L (ref 40–129)
Anion Gap: 13 (ref 3–16)
BUN: 7 mg/dL (ref 7–20)
CO2: 23 mmol/L (ref 21–32)
Calcium: 9.6 mg/dL (ref 8.3–10.6)
Chloride: 105 mmol/L (ref 99–110)
Creatinine: 0.7 mg/dL — ABNORMAL LOW (ref 0.9–1.3)
Est, Glom Filt Rate: >90 (ref >60)
Glucose: 116 mg/dL — ABNORMAL HIGH (ref 70–99)
Potassium: 4.1 mmol/L (ref 3.5–5.1)
Sodium: 141 mmol/L (ref 136–145)
Total Bilirubin: 0.6 mg/dL (ref 0.0–1.0)
Total Protein: 6.7 g/dL (ref 6.4–8.2)

## 2023-04-23 LAB — LIPID PANEL
Cholesterol, Total: 109 mg/dL (ref 0–199)
HDL: 49 mg/dL (ref 40–60)
LDL Cholesterol: 36 mg/dL (ref <100)
Triglycerides: 121 mg/dL (ref 0–150)
VLDL Cholesterol Calculated: 24 mg/dL

## 2023-04-23 LAB — MICROALBUMIN / CREATININE URINE RATIO
Creatinine, Ur: 84.6 mg/dL (ref 39.0–259.0)
Microalb, Ur: <1.2 mg/dL (ref <2.0)

## 2023-10-07 ENCOUNTER — Ambulatory Visit
Admit: 2023-10-07 | Discharge: 2023-10-07 | Payer: BLUE CROSS/BLUE SHIELD | Attending: Family Medicine | Admitting: Family Medicine | Primary: Family Medicine

## 2023-10-07 VITALS — BP 120/70 | HR 54 | Temp 96.00000°F | Wt 206.0 lb

## 2023-10-07 DIAGNOSIS — E119 Type 2 diabetes mellitus without complications: Principal | ICD-10-CM

## 2023-10-07 LAB — POCT GLYCOSYLATED HEMOGLOBIN (HGB A1C): Hemoglobin A1C: 6.2 %

## 2023-10-07 MED ORDER — SITAGLIPTIN PHOSPHATE 100 MG PO TABS
100 | ORAL_TABLET | Freq: Every day | ORAL | 1 refills | Status: DC
Start: 2023-10-07 — End: 2024-03-21

## 2023-10-07 MED ORDER — METFORMIN HCL 1000 MG PO TABS
1000 | ORAL_TABLET | Freq: Two times a day (BID) | ORAL | 1 refills | 30.00000 days | Status: DC
Start: 2023-10-07 — End: 2024-03-21

## 2023-10-07 MED ORDER — LISINOPRIL 20 MG PO TABS
20 | ORAL_TABLET | Freq: Every day | ORAL | 1 refills | 75.00000 days | Status: DC
Start: 2023-10-07 — End: 2024-03-21

## 2023-10-07 MED ORDER — ATORVASTATIN CALCIUM 20 MG PO TABS
20 | ORAL_TABLET | Freq: Every day | ORAL | 1 refills | 30.00000 days | Status: DC
Start: 2023-10-07 — End: 2024-03-21

## 2023-10-07 MED ORDER — PIOGLITAZONE HCL 45 MG PO TABS
45 | ORAL_TABLET | Freq: Every day | ORAL | 1 refills | Status: DC
Start: 2023-10-07 — End: 2024-03-21

## 2023-10-07 NOTE — Progress Notes (Signed)
 History of Present Illness  The patient presents for a checkup.    He reports no issues with his diabetes, although he inadvertently missed his medication yesterday, which he subsequently took in the afternoon. His blood glucose levels typically range between 130 and 150 upon waking. He does not experience any symptoms of hypoglycemia. He maintains an active lifestyle, including regular gym attendance twice weekly during the summer and frequent walking and biking at a campground. He occasionally experiences increased urination but does not report excessive thirst. He attributes the increased urination to his high coffee intake.    He has not received influenza vaccinations and has not undergone any colon cancer screening.    MEDICATIONS  Current: Actos     IMMUNIZATIONS  He has not received influenza vaccinations.       Current Outpatient Medications   Medication Sig Dispense Refill    atorvastatin  (LIPITOR) 20 MG tablet Take 1 tablet by mouth daily 90 tablet 1    SITagliptin  (JANUVIA ) 100 MG tablet Take 1 tablet by mouth daily 90 tablet 1    lisinopril  (PRINIVIL ;ZESTRIL ) 20 MG tablet Take 1 tablet by mouth daily 90 tablet 1    metFORMIN  (GLUCOPHAGE ) 1000 MG tablet Take 1 tablet by mouth 2 times daily (with meals) 180 tablet 1    pioglitazone  (ACTOS ) 45 MG tablet Take 1 tablet by mouth daily 90 tablet 1    latanoprost (XALATAN) 0.005 % ophthalmic solution INSTILL 1 DROP INTO EACH EYE EVERY DAY AT BEDTIME      Blood Glucose Monitoring Suppl (ACCU-CHEK AVIVA PLUS) W/DEVICE KIT   0    ACCU-CHEK AVIVA PLUS strip   5    Accu-Chek Softclix Lancets MISC   5     No current facility-administered medications for this visit.       No Known Allergies    Social History     Tobacco Use    Smoking status: Former    Smokeless tobacco: Never    Tobacco comments:     quti 2011, but smoked since age 52,  2.5 ppd   Substance Use Topics    Alcohol use: Yes          Objective:      BP 120/70 (Site: Left Upper Arm, Position: Sitting, Cuff  Size: Medium Adult)   Pulse 54   Temp (!) 96 F (35.6 C) (Infrared)   Wt 95.6 kg (210 lb 12.8 oz)   SpO2 97%   BMI 31.13 kg/m   General: Alert and oriented, in no distress   S1 and S2 normal, no murmurs, clicks, gallops or rubs. Regular rate and rhythm. Chest is clear; no wheezes or rales. No edema or JVD.  feet: normal DP and PT pulses, no trophic changes or ulcerative lesions, and normal monofilament exam   A1c 6.2%  Assessment:       Diagnosis Orders   1. Type 2 diabetes mellitus without complication, without long-term current use of insulin (HCC)  POCT glycosylated hemoglobin (Hb A1C)   Controlled metFORMIN  (GLUCOPHAGE ) 1000 MG tablet    pioglitazone  (ACTOS ) 45 MG tablet    SITagliptin  (JANUVIA ) 100 MG tablet    Comprehensive Metabolic Panel      2. Hyperlipidemia associated with type 2 diabetes mellitus (HCC)  atorvastatin  (LIPITOR) 20 MG tablet   Asymptomatic Comprehensive Metabolic Panel      3. HTN, goal below 140/90  lisinopril  (PRINIVIL ;ZESTRIL ) 20 MG tablet   Controlled Comprehensive Metabolic Panel      4. Colon cancer  screening  Fecal DNA Colorectal cancer screening (Cologuard)           Plan:       1)  Medication: continue current medication regimen unchanged due to effectiveness  2)  Recheck in 6 months, sooner should new symptoms or problems arise.       This note is intended for the physician writing it, as well as to communicate findings to other healthcare professionals.  Progress notes use the medical lexicon that may be misunderstood by non-medical persons. Therefore, interpretations of medical notes and terminology should be approached with caution

## 2023-10-08 LAB — COMPREHENSIVE METABOLIC PANEL
ALT: 39 U/L (ref 10–40)
AST: 31 U/L (ref 15–37)
Albumin/Globulin Ratio: 2.5 — ABNORMAL HIGH (ref 1.1–2.2)
Albumin: 5 g/dL (ref 3.4–5.0)
Alkaline Phosphatase: 64 U/L (ref 40–129)
Anion Gap: 16 (ref 3–16)
BUN: 9 mg/dL (ref 7–20)
CO2: 26 mmol/L (ref 21–32)
Calcium: 9.7 mg/dL (ref 8.3–10.6)
Chloride: 97 mmol/L — ABNORMAL LOW (ref 99–110)
Creatinine: 0.9 mg/dL (ref 0.9–1.3)
Est, Glom Filt Rate: >90 (ref >60)
Glucose: 139 mg/dL — ABNORMAL HIGH (ref 70–99)
Potassium: 4.6 mmol/L (ref 3.5–5.1)
Sodium: 139 mmol/L (ref 136–145)
Total Bilirubin: 1.6 mg/dL — ABNORMAL HIGH (ref 0.0–1.0)
Total Protein: 7 g/dL (ref 6.4–8.2)

## 2023-10-27 LAB — FECAL DNA COLORECTAL CANCER SCREENING (COLOGUARD): FIT-DNA (Cologuard): NEGATIVE

## 2024-03-21 ENCOUNTER — Ambulatory Visit
Admit: 2024-03-21 | Discharge: 2024-03-21 | Payer: BLUE CROSS/BLUE SHIELD | Attending: Family Medicine | Primary: Family Medicine

## 2024-03-21 VITALS — BP 114/80 | HR 66 | Temp 97.60000°F | Wt 204.8 lb

## 2024-03-21 DIAGNOSIS — E119 Type 2 diabetes mellitus without complications: Secondary | ICD-10-CM

## 2024-03-21 LAB — POCT GLYCOSYLATED HEMOGLOBIN (HGB A1C): Hemoglobin A1C: 6.2 %

## 2024-03-21 MED ORDER — SITAGLIPTIN PHOSPHATE 100 MG PO TABS
100 | ORAL_TABLET | Freq: Every day | ORAL | 1 refills | 30.00000 days | Status: DC
Start: 2024-03-21 — End: 2024-09-14

## 2024-03-21 MED ORDER — LISINOPRIL 20 MG PO TABS
20 | ORAL_TABLET | Freq: Every day | ORAL | 1 refills | 90.00000 days | Status: DC
Start: 2024-03-21 — End: 2024-07-05

## 2024-03-21 MED ORDER — PIOGLITAZONE HCL 45 MG PO TABS
45 | ORAL_TABLET | Freq: Every day | ORAL | 1 refills | 90.00000 days | Status: DC
Start: 2024-03-21 — End: 2024-07-05

## 2024-03-21 MED ORDER — METFORMIN HCL 1000 MG PO TABS
1000 | ORAL_TABLET | Freq: Two times a day (BID) | ORAL | 1 refills | 90.00000 days | Status: DC
Start: 2024-03-21 — End: 2024-07-05

## 2024-03-21 MED ORDER — ATORVASTATIN CALCIUM 20 MG PO TABS
20 | ORAL_TABLET | Freq: Every day | ORAL | 1 refills | 90.00000 days | Status: DC
Start: 2024-03-21 — End: 2024-07-05

## 2024-03-21 NOTE — Progress Notes (Signed)
 History of Present Illness  The patient presents for a follow-up on diabetes, hypertension, and hyperlipidemia.    He reports feeling generally well. He expressed a desire to transfer his prescriptions from [facility] to [facility], where his wife works. He received a letter from his insurance provider indicating that they will not cover the cost of Januvia . He is unsure about which alternative medication will be covered under his plan. His A1c remains stable at 6.2, consistent with the last three tests, indicating well-controlled diabetes.       Current Outpatient Medications   Medication Sig Dispense Refill    atorvastatin  (LIPITOR) 20 MG tablet Take 1 tablet by mouth daily 90 tablet 1    lisinopril  (PRINIVIL ;ZESTRIL ) 20 MG tablet Take 1 tablet by mouth daily 90 tablet 1    metFORMIN  (GLUCOPHAGE ) 1000 MG tablet Take 1 tablet by mouth 2 times daily (with meals) 180 tablet 1    pioglitazone  (ACTOS ) 45 MG tablet Take 1 tablet by mouth daily 90 tablet 1    SITagliptin  (JANUVIA ) 100 MG tablet Take 1 tablet by mouth daily 90 tablet 1    latanoprost (XALATAN) 0.005 % ophthalmic solution INSTILL 1 DROP INTO EACH EYE EVERY DAY AT BEDTIME      Blood Glucose Monitoring Suppl (ACCU-CHEK AVIVA PLUS) W/DEVICE KIT   0    ACCU-CHEK AVIVA PLUS strip   5    Accu-Chek Softclix Lancets MISC   5     No current facility-administered medications for this visit.       No Known Allergies    Social History     Tobacco Use    Smoking status: Former    Smokeless tobacco: Never    Tobacco comments:     quti 2011, but smoked since age 32,  2.5 ppd   Substance Use Topics    Alcohol use: Yes          Objective:      BP 114/80 (BP Site: Left Upper Arm, Patient Position: Sitting, BP Cuff Size: Medium Adult)   Pulse 66   Temp 97.6 F (36.4 C) (Infrared)   Wt 92.9 kg (204 lb 12.8 oz)   SpO2 97%   BMI 30.24 kg/m   General: Alert and oriented, in no distress   S1 and S2 normal, no murmurs, clicks, gallops or rubs. Regular rate and  rhythm. Chest is clear; no wheezes or rales. No edema or JVD.  feet: normal DP and PT pulses, no trophic changes or ulcerative lesions, and normal monofilament exam   A1c 6.2%  Assessment:          Assessment & Plan  1. Diabetes Mellitus.  - A1c level remains stable at 6.2, consistent with the results from the previous three tests, indicating well-managed diabetes.  - Physical examination included auscultation of heart and lungs, which were normal.  - Discussed insurance coverage issues for Januvia ; according to the formulary, Januvia  should be covered as a second-tier drug.  - Prescription for Januvia  100 mg tablet has been issued and sent to [facility]. Blood work and a urine sample have been ordered for further evaluation.    2. Hypertension.  - No specific changes or concerns were discussed during this visit.  - Blood pressure monitoring and current management plan will continue as is.  - No new physical exam findings or test results were noted.  - Continue current antihypertensive medications.    3. Hyperlipidemia.  - No specific changes or concerns were  discussed during this visit.  - Lipid levels will be monitored with the ordered blood work.  - No new physical exam findings or test results were noted.  - Continue current lipid-lowering therapy.    Follow-up  The patient will follow up in 6 months.    This note is intended for the physician writing it, as well as to communicate findings to other healthcare professionals.  Progress notes use the medical lexicon that may be misunderstood by non-medical persons. Therefore, interpretations of medical notes and terminology should be approached with caution

## 2024-03-22 ENCOUNTER — Encounter

## 2024-03-22 LAB — COMPREHENSIVE METABOLIC PANEL
ALT: 30 U/L (ref 10–40)
AST: 32 U/L (ref 15–37)
Albumin/Globulin Ratio: 2.4 — ABNORMAL HIGH (ref 1.1–2.2)
Albumin: 4.8 g/dL (ref 3.4–5.0)
Alkaline Phosphatase: 63 U/L (ref 40–129)
Anion Gap: 12 (ref 3–16)
BUN: 14 mg/dL (ref 7–20)
CO2: 26 mmol/L (ref 21–32)
Calcium: 9.8 mg/dL (ref 8.3–10.6)
Chloride: 102 mmol/L (ref 99–110)
Creatinine: 0.8 mg/dL — ABNORMAL LOW (ref 0.9–1.3)
Est, Glom Filt Rate: >90 (ref >60)
Glucose: 120 mg/dL — ABNORMAL HIGH (ref 70–99)
Potassium: 4.5 mmol/L (ref 3.5–5.1)
Sodium: 140 mmol/L (ref 136–145)
Total Bilirubin: 0.8 mg/dL (ref 0.0–1.0)
Total Protein: 6.8 g/dL (ref 6.4–8.2)

## 2024-03-22 LAB — ALBUMIN/CREATININE RATIO, URINE
Albumin Urine: <1.2 mg/dL (ref <2.0)
Creatinine, Ur: 121 mg/dL (ref 39.0–259.0)

## 2024-03-22 LAB — LIPID PANEL
Cholesterol, Total: 131 mg/dL (ref 0–199)
HDL: 52 mg/dL (ref 40–60)
LDL Cholesterol: 63 mg/dL (ref <100)
Triglycerides: 80 mg/dL (ref 0–150)
VLDL Cholesterol Calculated: 16 mg/dL

## 2024-03-22 MED ORDER — SEMAGLUTIDE 3 MG PO TABS
3 | ORAL_TABLET | Freq: Every day | ORAL | 1 refills | 56.00000 days | Status: DC
Start: 2024-03-22 — End: 2024-09-14

## 2024-03-22 NOTE — Telephone Encounter (Signed)
 [facility] through his insurance stated the Januvia  is not covered. She tried running other medication to see what was covered. She stated the only 2 that showed covered was the Rybelsus and Trulicity but they state covered with prior authorization.

## 2024-03-22 NOTE — Telephone Encounter (Signed)
 Please contact the insurance company to see what is going on.

## 2024-07-05 ENCOUNTER — Encounter

## 2024-07-05 MED ORDER — LISINOPRIL 20 MG PO TABS
20 | ORAL_TABLET | Freq: Every day | ORAL | 0 refills | 90.00000 days | Status: DC
Start: 2024-07-05 — End: 2024-09-14

## 2024-07-05 MED ORDER — METFORMIN HCL 1000 MG PO TABS
1000 | ORAL_TABLET | Freq: Two times a day (BID) | ORAL | 0 refills | 90.00000 days | Status: DC
Start: 2024-07-05 — End: 2024-09-14

## 2024-07-05 MED ORDER — PIOGLITAZONE HCL 45 MG PO TABS
45 | ORAL_TABLET | Freq: Every day | ORAL | 0 refills | 90.00000 days | Status: DC
Start: 2024-07-05 — End: 2024-09-14

## 2024-07-05 MED ORDER — ATORVASTATIN CALCIUM 20 MG PO TABS
20 | ORAL_TABLET | Freq: Every day | ORAL | 0 refills | 60.00000 days | Status: DC
Start: 2024-07-05 — End: 2024-09-12

## 2024-07-05 NOTE — Telephone Encounter (Signed)
 Patient asking for his medication to be sent to [facility] on [address].

## 2024-07-05 NOTE — Telephone Encounter (Signed)
 Patient informed.

## 2024-09-05 ENCOUNTER — Encounter: Payer: BLUE CROSS/BLUE SHIELD | Attending: Family Medicine | Primary: Family Medicine

## 2024-09-12 ENCOUNTER — Encounter

## 2024-09-12 MED ORDER — ATORVASTATIN CALCIUM 20 MG PO TABS
20 | ORAL_TABLET | Freq: Every day | ORAL | 1 refills | 60.00000 days | Status: DC
Start: 2024-09-12 — End: 2024-09-14

## 2024-09-14 ENCOUNTER — Ambulatory Visit
Admit: 2024-09-14 | Discharge: 2024-09-14 | Payer: BLUE CROSS/BLUE SHIELD | Attending: Family Medicine | Primary: Family Medicine

## 2024-09-14 VITALS — BP 132/86 | HR 54 | Temp 96.80000°F | Wt 207.0 lb

## 2024-09-14 DIAGNOSIS — E119 Type 2 diabetes mellitus without complications: Principal | ICD-10-CM

## 2024-09-14 LAB — POCT GLYCOSYLATED HEMOGLOBIN (HGB A1C): Hemoglobin A1C: 5.6 %

## 2024-09-14 MED ORDER — ATORVASTATIN CALCIUM 20 MG PO TABS
20 | ORAL_TABLET | Freq: Every day | ORAL | 1 refills | Status: AC
Start: 2024-09-14 — End: ?

## 2024-09-14 MED ORDER — METFORMIN HCL 1000 MG PO TABS
1000 | ORAL_TABLET | Freq: Two times a day (BID) | ORAL | 1 refills | Status: AC
Start: 2024-09-14 — End: ?

## 2024-09-14 MED ORDER — LISINOPRIL 20 MG PO TABS
20 | ORAL_TABLET | Freq: Every day | ORAL | 1 refills | Status: AC
Start: 2024-09-14 — End: 2025-09-14

## 2024-09-14 MED ORDER — PIOGLITAZONE HCL 45 MG PO TABS
45 | ORAL_TABLET | Freq: Every day | ORAL | 1 refills | Status: AC
Start: 2024-09-14 — End: ?

## 2024-09-14 NOTE — Progress Notes (Signed)
 "History of Present Illness  The patient presents for evaluation of diabetes, blood pressure, and cholesterol management.    He reports satisfactory control of his diabetes, with an A1c level of 5.6. Blood glucose levels are not frequently monitored. The current medication regimen includes metformin  and Actos , while Januvia  has been discontinued due to insurance issues. He is not on semaglutide . Regular exercise includes gym activities 2 to 3 days per week, and dietary habits have improved.    There are no reports of chest pain, shortness of breath, dizziness, headaches, or unexplained muscle aches. Transient lightheadedness occurs upon standing up quickly, which is attributed to inadequate hydration. Lisinopril  is taken for blood pressure management. Occasional back pain is noted, believed to be age-related.    Social History:  Occupation: Chief Of Staff: Hiking, gym activities  Diet: Improved dietary habits    PAST SURGICAL HISTORY:  TURP in 2021      Current Outpatient Medications   Medication Sig Dispense Refill    atorvastatin  (LIPITOR) 20 MG tablet Take 1 tablet by mouth daily 90 tablet 1    lisinopril  (PRINIVIL ;ZESTRIL ) 20 MG tablet Take 1 tablet by mouth daily 90 tablet 0    metFORMIN  (GLUCOPHAGE ) 1000 MG tablet Take 1 tablet by mouth 2 times daily (with meals) 180 tablet 0    pioglitazone  (ACTOS ) 45 MG tablet Take 1 tablet by mouth daily 90 tablet 0    Semaglutide  3 MG TABS Take 3 mg by mouth daily 90 tablet 1    SITagliptin  (JANUVIA ) 100 MG tablet Take 1 tablet by mouth daily 90 tablet 1    latanoprost (XALATAN) 0.005 % ophthalmic solution INSTILL 1 DROP INTO EACH EYE EVERY DAY AT BEDTIME      Blood Glucose Monitoring Suppl (ACCU-CHEK AVIVA PLUS) W/DEVICE KIT   0    ACCU-CHEK AVIVA PLUS strip   5    Accu-Chek Softclix Lancets MISC   5     No current facility-administered medications for this visit.       No Known Allergies    Social History     Tobacco Use    Smoking status: Former     Smokeless tobacco: Never    Tobacco comments:     quti 2011, but smoked since age 24,  2.5 ppd   Substance Use Topics    Alcohol use: Yes          Objective:      BP 132/86 (BP Site: Left Upper Arm, Patient Position: Sitting, BP Cuff Size: Medium Adult)   Pulse 54   Temp 96.8 F (36 C) (Infrared)   Wt 93.9 kg (207 lb)   SpO2 97%   BMI 30.57 kg/m   General: Alert and oriented, in no distress   S1 and S2 normal, no murmurs, clicks, gallops or rubs. Regular rate and rhythm. Chest is clear; no wheezes or rales. No edema or JVD.  feet: normal DP and PT pulses, no trophic changes or ulcerative lesions, and normal monofilament exam   A1c 5.6%  Assessment & Plan  1. Diabetes mellitus:  - His A1c level is commendably at 5.6, indicating excellent control of his diabetes.  - He is currently on metformin  and Actos .  - Januvia  was previously discontinued by the insurance company and will be removed from his records as it is not needed.  - He is advised to continue his current medication regimen and maintain regular exercise and a balanced diet.    2. Blood pressure management:  -  His blood pressure readings are within the normal range.  - He is currently taking lisinopril .  - He reports occasional lightheadedness when standing up too quickly, which may be due to dehydration.  - He is advised to increase his water intake and monitor the color of his urine.    3. Cholesterol management:  - He is currently taking atorvastatin  for cholesterol management.  - He reports no unexplained muscle aches, attributing any muscle discomfort to age or physical activity.  - He is advised to continue his current medication regimen.    Follow-up: The patient will follow up in 02/2025.    This note is intended for the physician writing it, as well as to communicate findings to other healthcare professionals.  Progress notes use the medical lexicon that may be misunderstood by non-medical persons. Therefore, interpretations of medical notes  and terminology should be approached with caution    "

## 2024-09-15 LAB — COMPREHENSIVE METABOLIC PANEL
ALT: 34 U/L (ref 10–40)
AST: 30 U/L (ref 15–37)
Albumin/Globulin Ratio: 2.5 — ABNORMAL HIGH (ref 1.1–2.2)
Albumin: 4.7 g/dL (ref 3.4–5.0)
Alkaline Phosphatase: 62 U/L (ref 40–129)
Anion Gap: 11 (ref 3–16)
BUN: 10 mg/dL (ref 7–20)
CO2: 26 mmol/L (ref 21–32)
Calcium: 9.8 mg/dL (ref 8.3–10.6)
Chloride: 100 mmol/L (ref 99–110)
Creatinine: 0.9 mg/dL (ref 0.9–1.3)
Est, Glom Filt Rate: >90
Glucose: 146 mg/dL — ABNORMAL HIGH (ref 70–99)
Potassium: 4.2 mmol/L (ref 3.5–5.1)
Sodium: 137 mmol/L (ref 136–145)
Total Bilirubin: 1 mg/dL (ref 0.0–1.0)
Total Protein: 6.6 g/dL (ref 6.4–8.2)
